# Patient Record
Sex: Male | Born: 1955
Health system: Southern US, Community
[De-identification: ages and names within clinical notes are randomized; demographics above are authoritative.]

## PROBLEM LIST (undated history)

## (undated) DIAGNOSIS — B029 Zoster without complications: Secondary | ICD-10-CM

## (undated) DIAGNOSIS — N410 Acute prostatitis: Secondary | ICD-10-CM

## (undated) DIAGNOSIS — E785 Hyperlipidemia, unspecified: Secondary | ICD-10-CM

## (undated) DIAGNOSIS — K8020 Calculus of gallbladder without cholecystitis without obstruction: Secondary | ICD-10-CM

## (undated) DIAGNOSIS — H269 Unspecified cataract: Secondary | ICD-10-CM

## (undated) DIAGNOSIS — K8001 Calculus of gallbladder with acute cholecystitis with obstruction: Secondary | ICD-10-CM

## (undated) HISTORY — DX: Calculus of gallbladder without cholecystitis without obstruction: K80.20

## (undated) HISTORY — DX: Hyperlipidemia, unspecified: E78.5

## (undated) HISTORY — DX: Unspecified cataract: H26.9

## (undated) HISTORY — DX: Calculus of gallbladder with acute cholecystitis with obstruction: K80.01

## (undated) HISTORY — DX: Zoster without complications: B02.9

## (undated) HISTORY — DX: Acute prostatitis: N41.0

---

## 1975-10-11 HISTORY — PX: WISDOM TOOTH EXTRACTION: SHX21

## 1990-10-10 HISTORY — PX: SEPTOPLASTY: SUR1290

## 1999-08-26 ENCOUNTER — Other Ambulatory Visit: Admission: RE | Admit: 1999-08-26 | Discharge: 1999-08-26 | Payer: Self-pay | Admitting: *Deleted

## 1999-08-26 ENCOUNTER — Encounter (INDEPENDENT_AMBULATORY_CARE_PROVIDER_SITE_OTHER): Payer: Self-pay | Admitting: Specialist

## 2005-01-05 ENCOUNTER — Ambulatory Visit: Payer: Self-pay | Admitting: Family Medicine

## 2005-01-12 ENCOUNTER — Ambulatory Visit: Payer: Self-pay | Admitting: Family Medicine

## 2005-01-28 ENCOUNTER — Ambulatory Visit: Payer: Self-pay | Admitting: Family Medicine

## 2005-12-23 ENCOUNTER — Ambulatory Visit: Payer: Self-pay | Admitting: Family Medicine

## 2006-10-13 ENCOUNTER — Ambulatory Visit: Payer: Self-pay | Admitting: Family Medicine

## 2006-10-13 LAB — CONVERTED CEMR LAB: PSA: 0.52 ng/mL

## 2006-10-17 ENCOUNTER — Ambulatory Visit: Payer: Self-pay | Admitting: Family Medicine

## 2006-10-24 ENCOUNTER — Ambulatory Visit: Payer: Self-pay

## 2006-10-24 HISTORY — PX: OTHER SURGICAL HISTORY: SHX169

## 2006-10-30 ENCOUNTER — Ambulatory Visit: Payer: Self-pay | Admitting: Family Medicine

## 2009-03-26 ENCOUNTER — Encounter: Payer: Self-pay | Admitting: Family Medicine

## 2009-03-26 DIAGNOSIS — E785 Hyperlipidemia, unspecified: Secondary | ICD-10-CM | POA: Insufficient documentation

## 2009-03-30 ENCOUNTER — Ambulatory Visit: Payer: Self-pay | Admitting: Family Medicine

## 2009-04-07 ENCOUNTER — Encounter: Payer: Self-pay | Admitting: Family Medicine

## 2010-05-18 ENCOUNTER — Encounter (INDEPENDENT_AMBULATORY_CARE_PROVIDER_SITE_OTHER): Payer: Self-pay | Admitting: *Deleted

## 2010-11-05 ENCOUNTER — Telehealth: Payer: Self-pay | Admitting: Family Medicine

## 2010-11-08 ENCOUNTER — Encounter: Payer: Self-pay | Admitting: Family Medicine

## 2010-11-09 NOTE — Letter (Signed)
Summary: Nadara Eaton letter  [place] at [facility]  [address] [place], [place] [address]   Phone: [phone]  Fax: [phone]       05/18/2010 MRN: [id number]  [facility] [address] [place], [place]  [address]  Dear Mr. MOHSEN, [facility] [place], and [facility] announce the retirement of Arta Silence, M.D., from full-time practice at the [facility] office effective April 08, 2010 and his plans of returning part-time.  It is important to Dr. Hetty Ely and to our practice that you understand that [facility] has seven physicians in our office for your health care needs.  We will continue to offer the same exceptional care that you have today.    Dr. Hetty Ely has spoken to many of you about his plans for retirement and returning part-time in the fall.   We will continue to work with you through the transition to schedule appointments for you in the office and meet the high standards that [place] is committed to.   Again, it is with great pleasure that we share the news that Dr. Hetty Ely will return to [facility] at [facility] in October of 2011 with a reduced schedule.    If you have any questions, or would like to request an appointment with one of our physicians, please call us at [phone] and press the option for Scheduling an appointment.  We take pleasure in providing you with excellent patient care and look forward to seeing you at your next office visit.  Our [facility] Physicians are:  Tillman Abide, M.D. Laurita Quint, M.D. Roxy Manns, M.D. Kerby Nora, M.D. Hannah Beat, M.D. Ruthe Mannan, M.D. We proudly welcomed Raechel Ache, M.D. and Eustaquio Boyden, M.D. to the practice in July/August 2011.  Sincerely,   [facility]

## 2010-11-10 ENCOUNTER — Encounter (INDEPENDENT_AMBULATORY_CARE_PROVIDER_SITE_OTHER): Payer: 59 | Admitting: Family Medicine

## 2010-11-10 ENCOUNTER — Encounter: Payer: Self-pay | Admitting: Family Medicine

## 2010-11-10 ENCOUNTER — Ambulatory Visit: Admit: 2010-11-10 | Payer: Self-pay | Admitting: Family Medicine

## 2010-11-10 DIAGNOSIS — Z0000 Encounter for general adult medical examination without abnormal findings: Secondary | ICD-10-CM

## 2010-11-11 NOTE — Progress Notes (Signed)
Summary: pt needs order for labs  Phone Note Call from Patient Call back at [phone]   Caller: Patient Call For: Shaune Leeks MD Summary of Call: Pt has an appt for a physical on 2/1 and he is asking for an order for labs to take to [facility].  Dr. Paralee Cancel be back in office until 2/1, will you write order for pt?  Please call pt when ready. Initial call taken by: Lowella Petties CMA, AAMA,  November 05, 2010 9:15 AM  Follow-up for Phone Call        Please order Comp Metabolic 790.29 and 272.4, Lipid profile 272.4 TSH 272.4 PSA V76.44 and Microalbunmin 790.29. Follow-up by: Shaune Leeks MD,  November 05, 2010 12:27 PM  Additional Follow-up for Phone Call Additional follow up Details #1::        Wrote orders on paper, stamped with Dr. Lyndel Pleasure signature, given to pt. Additional Follow-up by: Lowella Petties CMA, AAMA,  November 05, 2010 1:08 PM

## 2010-11-17 ENCOUNTER — Encounter (INDEPENDENT_AMBULATORY_CARE_PROVIDER_SITE_OTHER): Payer: Self-pay | Admitting: *Deleted

## 2010-11-17 ENCOUNTER — Other Ambulatory Visit: Payer: 59

## 2010-11-17 ENCOUNTER — Other Ambulatory Visit: Payer: Self-pay

## 2010-11-17 DIAGNOSIS — Z0000 Encounter for general adult medical examination without abnormal findings: Secondary | ICD-10-CM

## 2010-11-17 NOTE — Letter (Signed)
Summary: [place] [facility]: Immunoassay Fecal Occult Blood (iFOB) Order Form  [place] at [facility]  [address] [place], [place] [address]   Phone: [phone]  Fax: [phone]      [place] [facility]: Immunoassay Fecal Occult Blood (iFOB) Order Form   November 10, 2010 MRN: [id number]   KIAM BRANSFIELD 24-Dec-1955   Physicican Name:______Dr Schaller___________________  Diagnosis Code:______V70.0____________________      Shaune Leeks MD

## 2010-11-17 NOTE — Assessment & Plan Note (Signed)
Summary: CPX/CLE   Vital Signs:  Patient profile:   55 year old male Weight:      201 pounds BMI:     28.14 Temp:     97.4 degrees F oral Pulse rate:   64 / minute Pulse rhythm:   regular BP sitting:   106 / 68  (left arm) Cuff size:   large  Vitals Entered By: Sydell Axon LPN (November 10, 2010 9:29 AM) CC: 30 Minute checkup   History of Present Illness: Pt here for Comp Exam. HE has had some back discomfort with his exercises and has gained a few pounds back.  Preventive Screening-Counseling & Management  Alcohol-Tobacco     Alcohol drinks/day: <1     Alcohol type: wine occas liquor     Smoking Status: current     Cigars/week: one a year     Pipe use/week: occas  Caffeine-Diet-Exercise     Caffeine use/day: 3 per day     Does Patient Exercise: yes     Type of exercise: running/treadmill     Exercise (avg: min/session): 11/2 hrs every other dayaltn with light wts     Times/week: 6  Problems Prior to Update: 1)  Health Maintenance Exam  (ICD-V70.0) 2)  Coronary Art Dz, Fam Hx (BRO DEC 54YOA)  (ICD-V17.3) 3)  Hyperglycemia (102)  (ICD-790.29) 4)  Hyperlipidemia  (ICD-272.4)  Medications Prior to Update: 1)  Aspirin 81 Mg  Tabs (Aspirin) .... One Daily  Current Medications (verified): 1)  Aspirin 81 Mg  Tabs (Aspirin) .... One Daily 2)  Multivitamins  Tabs (Multiple Vitamin) .... Take One By Mouth Daily 3)  Calcium 1000 + D 1000-800 Mg-Unit Tabs (Calcium Carb-Cholecalciferol) .... Take One By Mouth Daily  Allergies: No Known Drug Allergies  Past History:  Past Medical History: Last updated: 03/26/2009 Hyperlipidemia  Family History: Last updated: 11/10/2010 Father:  dec 76 Emphysema Mother: dec74 pneumonia due to severe peripheral neuropahty and other related neuropathies Brother dec 54  MI Sister A 62  hypertension  diabetes HBP + sister DM + sister  Cancer - none Depression - none ETOH/Drug abuse - none  Social History: Last updated:  03/26/2009 Marital Status: Married lives with wife Children: 3 Occupation: Therapist, sports for [employer]  Risk Factors: Alcohol Use: <1 (11/10/2010) Caffeine Use: 3 per day (11/10/2010) Exercise: yes (11/10/2010)  Risk Factors: Smoking Status: current (11/10/2010) Cigars/wk: one a year (11/10/2010) Pipe Use/wk: occas (11/10/2010)  Past Surgical History: UVPP and Septoplasty 1992 Stress Myoview, wnl except ST seg decrease inferolaterally 10/24/06  Family History: Father:  dec 76 Emphysema Mother: dec74 pneumonia due to severe peripheral neuropahty and other related neuropathies Brother dec 54  MI Sister A 62  hypertension  diabetes HBP + sister DM + sister  Cancer - none Depression - none ETOH/Drug abuse - none  Review of Systems General:  Denies chills, fatigue, fever, sweats, weakness, and weight loss. Eyes:  Denies blurring, discharge, and eye pain. ENT:  Denies decreased hearing, earache, and ringing in ears. CV:  Denies chest pain or discomfort, fainting, fatigue, palpitations, shortness of breath with exertion, swelling of feet, and swelling of hands. Resp:  Denies cough, shortness of breath, and wheezing. GI:  Denies abdominal pain, bloody stools, change in bowel habits, constipation, dark tarry stools, diarrhea, indigestion, loss of appetite, nausea, vomiting, vomiting blood, and yellowish skin color. GU:  Complains of urinary frequency; denies discharge, dysuria, and nocturia. MS:  Complains of low back pain; denies joint pain, muscle aches,  and stiffness; from exercising. Derm:  Complains of dryness; denies itching and rash. Neuro:  Denies numbness, poor balance, tingling, and tremors.  Physical Exam  General:  Well-developed,well-nourished,in no acute distress; alert,appropriate and cooperative throughout examination Head:  Normocephalic and atraumatic without obvious abnormalities. No apparent alopecia or balding. Sinuses NT. Eyes:  Conjunctiva clear bilaterally.   Ears:  External ear exam shows no significant lesions or deformities.  Otoscopic examination reveals clear canals, tympanic membranes are intact bilaterally without bulging, retraction, inflammation or discharge. Hearing is grossly normal bilaterally. Nose:  External nasal examination shows no deformity or inflammation. Nasal mucosa are pink and moist without lesions or exudates. Mouth:  Oral mucosa and oropharynx without lesions or exudates.  Teeth in good repair. Neck:  No deformities, masses, or tenderness noted. Chest Wall:  No deformities, masses, tenderness or gynecomastia noted. Breasts:  No masses or gynecomastia noted Lungs:  Normal respiratory effort, chest expands symmetrically. Lungs are clear to auscultation, no crackles or wheezes. Heart:  Normal rate and regular rhythm. S1 and S2 normal without gallop, murmur, click, rub or other extra sounds. Abdomen:  Bowel sounds positive,abdomen soft and non-tender without masses, organomegaly or hernias noted. Rectal:  No external abnormalities noted. Normal sphincter tone. No rectal masses or tenderness. G neg. Genitalia:  Testes bilaterally descended without nodularity, tenderness or masses. No scrotal masses or lesions. No penis lesions or urethral discharge. Prostate:  Prostate gland firm and smooth, no enlargement, nodularity, tenderness, mass, asymmetry or induration. 20gms. Msk:  No deformity or scoliosis noted of thoracic or lumbar spine.   Pulses:  R and L carotid,radial,femoral,dorsalis pedis and posterior tibial pulses are full and equal bilaterally Extremities:  No clubbing, cyanosis, edema, or deformity noted with normal full range of motion of all joints.   Neurologic:  No cranial nerve deficits noted. Station and gait are normal. Sensory, motor and coordinative functions appear intact. Skin:  Intact without suspicious lesions or rashes Cervical Nodes:  No lymphadenopathy noted Inguinal Nodes:  No significant adenopathy Psych:   Cognition and judgment appear intact. Alert and cooperative with normal attention span and concentration. No apparent delusions, illusions, hallucinations   Impression & Recommendations:  Problem # 1:  HEALTH MAINTENANCE EXAM (ICD-V70.0)  Reviewed preventive care protocols, scheduled due services, and updated immunizations. Still prefers stool cards for colon cancer screening.  Problem # 2:  HYPERGLYCEMIA (102) (ICD-790.29) Assessment: Improved Euglycemic with weight loss, altho slight gain since last visit.   Problem # 3:  HYPERLIPIDEMIA (ICD-272.4) Assessment: Unchanged Nos great. Suggest fatty food restriction to decrease LDL even more.  Complete Medication List: 1)  Aspirin 81 Mg Tabs (Aspirin) .... One daily 2)  Multivitamins Tabs (Multiple vitamin) .... Take one by mouth daily 3)  Calcium 1000 + D 1000-800 Mg-unit Tabs (Calcium carb-cholecalciferol) .... Take one by mouth daily  Patient Instructions: 1)  RTC one year, sooner as needed.   Orders Added: 1)  Est. Patient 40-64 years [99396]    Current Allergies (reviewed today): No known allergies

## 2010-11-18 ENCOUNTER — Encounter (INDEPENDENT_AMBULATORY_CARE_PROVIDER_SITE_OTHER): Payer: Self-pay | Admitting: *Deleted

## 2010-11-25 NOTE — Letter (Signed)
Summary: Results Follow up Letter  [place] [facility]  [address] [place], [place] [address]   Phone: [phone]  Fax: [phone]    11/18/2010 MRN: [id number]    Marc Bolton [address] [place], [place]  [address]  [place]    Dear Mr. VERBA,  The following are the results of your recent test(s):  Test         Result    Pap Smear:        Normal _____  Not Normal _____ Comments: ______________________________________________________ Cholesterol: LDL(Bad cholesterol):         Your goal is less than:         HDL (Good cholesterol):       Your goal is more than: Comments:  ______________________________________________________ Mammogram:        Normal _____  Not Normal _____ Comments:  ___________________________________________________________________ Hemoccult:        Normal __X___  Not normal _______ Comments:  Please repeat in one year.  _____________________________________________________________________ Other Tests:    We routinely do not discuss normal results over the telephone.  If you desire a copy of the results, or you have any questions about this information we can discuss them at your next office visit.   Sincerely,      Laurita Quint, MD

## 2011-07-01 ENCOUNTER — Telehealth: Payer: Self-pay | Admitting: *Deleted

## 2011-07-01 NOTE — Telephone Encounter (Signed)
Patient called inquiring about the Shingles vaccine.  He stated that he doesn't remember whether or not he had the chicken pox as a child but would like the shingles vaccine.  Advised patient to check with his insurance company to see if they will cover the vaccine and call us back.  Is it ok for patient to have shingles vaccine?

## 2011-07-01 NOTE — Telephone Encounter (Signed)
Patient advised as instructed via telephone. 

## 2011-07-01 NOTE — Telephone Encounter (Signed)
It is ok with me. If insurance oks, may send script to pharmacy of choice.

## 2011-11-29 ENCOUNTER — Telehealth: Payer: Self-pay | Admitting: Family Medicine

## 2011-11-29 NOTE — Telephone Encounter (Signed)
Placed script in kim's box.

## 2011-11-29 NOTE — Telephone Encounter (Signed)
Patient is transferring from Dr.Schaller to you.  His CPX is scheduled for March 4th.  Patient has his labwork done at [facility].  Can he get an order for the labwork?

## 2011-11-30 NOTE — Telephone Encounter (Signed)
Message left notifying patient. Script placed up front for patient pick up.

## 2011-12-05 ENCOUNTER — Encounter: Payer: Self-pay | Admitting: Family Medicine

## 2011-12-09 ENCOUNTER — Encounter: Payer: Self-pay | Admitting: Family Medicine

## 2011-12-12 ENCOUNTER — Encounter: Payer: Self-pay | Admitting: Family Medicine

## 2011-12-12 ENCOUNTER — Encounter: Payer: Self-pay | Admitting: Gastroenterology

## 2011-12-12 ENCOUNTER — Ambulatory Visit (INDEPENDENT_AMBULATORY_CARE_PROVIDER_SITE_OTHER): Payer: 59 | Admitting: Family Medicine

## 2011-12-12 VITALS — BP 110/70 | HR 64 | Temp 98.8°F | Ht 72.0 in | Wt 210.0 lb

## 2011-12-12 DIAGNOSIS — Z0000 Encounter for general adult medical examination without abnormal findings: Secondary | ICD-10-CM | POA: Insufficient documentation

## 2011-12-12 DIAGNOSIS — E785 Hyperlipidemia, unspecified: Secondary | ICD-10-CM

## 2011-12-12 DIAGNOSIS — Z1211 Encounter for screening for malignant neoplasm of colon: Secondary | ICD-10-CM

## 2011-12-12 DIAGNOSIS — Z23 Encounter for immunization: Secondary | ICD-10-CM

## 2011-12-12 MED ORDER — DICLOFENAC SODIUM 1 % TD GEL: 1.0000 "application " | Freq: Three times a day (TID) | TRANSDERMAL | Status: DC | PRN

## 2011-12-12 NOTE — Progress Notes (Signed)
Subjective:    Patient ID: Marc Bolton, male    DOB: 07/21/1956, 56 y.o.   MRN: [id number]  HPI CC: CPE  No questions or concerns today.  Runs 10ks.  Takes ibuprofen prn, has used voltaren gel in past and done well with this requests refill.  No h/o GERD or PUD or other abd issues.  Discussed calcium supplement use.  Daughter pregnant, husband leaving to be deployed for 1 year.  Pt wants immunizations   Preventative: Last CPE 1 year ago.  Brings labs from [facility]. Colon screening - would like colonoscopy. prostate - gets checked yearly.  Nocturia - 1x/night.  Normal stream  Flu - 2012 Td - 2006.  Requests Tdap today.  Caffeine: 3 cups coffee/day Married; Lives with wife 3 children Data processor for [employer] Activity: works out 4x/wk, runs 15-20 miles /wk Diet: good water, fruits/vegetables daily, very little fried food/fatty food  Medications and allergies reviewed and updated in chart.  Past histories reviewed and updated if relevant as below. Patient Active Problem List  Diagnoses  . HYPERLIPIDEMIA   Past Medical History  Diagnosis Date  . HLD (hyperlipidemia)    Past Surgical History  Procedure Date  . Uvulopalatopharyngoplasty 1992  . Septoplasty 1992  . Stress myoview 10/24/06    WNL except ST seg decrease inferiolaterally   History  Substance Use Topics  . Smoking status: Never Smoker   . Smokeless tobacco: Never Used   Comment: Very rare cigar  . Alcohol Use: Yes     Wine-Regular   Family History  Problem Relation Age of Onset  . Emphysema Father   . Neuropathy Mother     severe peripheral  . Hypertension Sister   . Diabetes Sister   . Coronary artery disease Brother 53  . Stroke Neg Hx   . Cancer Neg Hx    No Known Allergies Current Outpatient Prescriptions on File Prior to Visit  Medication Sig Dispense Refill  . aspirin 81 MG tablet Take 81 mg by mouth daily.      . Calcium Carb-Cholecalciferol 1000-800 MG-UNIT TABS Take 1 tablet by  mouth daily.      . Multiple Vitamin (MULTIVITAMIN) tablet Take 1 tablet by mouth daily.          Review of Systems  Constitutional: Negative for fever, chills, activity change, appetite change, fatigue and unexpected weight change.  HENT: Negative for hearing loss and neck pain.   Eyes: Negative for visual disturbance.  Respiratory: Negative for cough, chest tightness, shortness of breath and wheezing.   Cardiovascular: Negative for chest pain, palpitations and leg swelling.  Gastrointestinal: Negative for nausea, vomiting, abdominal pain, diarrhea, constipation, blood in stool and abdominal distention.  Genitourinary: Negative for hematuria and difficulty urinating.  Musculoskeletal: Negative for myalgias and arthralgias.  Skin: Negative for rash.  Neurological: Negative for dizziness, seizures, syncope and headaches.  Hematological: Does not bruise/bleed easily.  Psychiatric/Behavioral: Negative for dysphoric mood. The patient is not nervous/anxious.        Objective:   Physical Exam  Nursing note and vitals reviewed. Constitutional: He is oriented to person, place, and time. He appears well-developed and well-nourished. No distress.  HENT:  Head: Normocephalic and atraumatic.  Right Ear: External ear normal.  Left Ear: External ear normal.  Nose: Nose normal.  Mouth/Throat: Oropharynx is clear and moist. No oropharyngeal exudate.  Eyes: Conjunctivae and EOM are normal. Pupils are equal, round, and reactive to light. No scleral icterus.  Neck: Normal range of motion. Neck  supple.  Cardiovascular: Normal rate, regular rhythm, normal heart sounds and intact distal pulses.   No murmur heard. Pulses:      Radial pulses are 2+ on the right side, and 2+ on the left side.  Pulmonary/Chest: Effort normal and breath sounds normal. No respiratory distress. He has no wheezes. He has no rales.  Abdominal: Soft. Bowel sounds are normal. He exhibits no distension and no mass. There is no  tenderness. There is no rebound and no guarding.  Genitourinary: Rectum normal and prostate normal. Rectal exam shows no external hemorrhoid, no internal hemorrhoid, no fissure, no mass, no tenderness and anal tone normal. Prostate is not enlarged (20gm) and not tender.  Musculoskeletal: Normal range of motion. He exhibits no edema.  Lymphadenopathy:    He has no cervical adenopathy.  Neurological: He is alert and oriented to person, place, and time.       CN grossly intact, station and gait intact  Skin: Skin is warm and dry. No rash noted.  Psychiatric: He has a normal mood and affect. His behavior is normal. Judgment and thought content normal.      Assessment & Plan:

## 2011-12-12 NOTE — Assessment & Plan Note (Addendum)
Preventative protocols reviewed and updated unless pt declined. Tdap today. Discussed yearly flu shot. Discussed healthy diet/lifestyle changes. Refer for screening colonoscopy. DRE/PSA reassuring today.

## 2011-12-12 NOTE — Assessment & Plan Note (Signed)
Reviewed numbers with pt fmhx CAD, rec LDL goal <100, but <130 likely adequate. rec stop calcium supplements.

## 2011-12-12 NOTE — Patient Instructions (Signed)
Tdap today. Flu shot recommended next year. Good to see you today, call us with quesitons. Return in 1 year or as needed.

## 2012-01-09 HISTORY — PX: COLONOSCOPY: SHX174

## 2012-01-17 ENCOUNTER — Ambulatory Visit (AMBULATORY_SURGERY_CENTER): Payer: 59 | Admitting: *Deleted

## 2012-01-17 VITALS — Ht 72.0 in | Wt 207.7 lb

## 2012-01-17 DIAGNOSIS — Z1211 Encounter for screening for malignant neoplasm of colon: Secondary | ICD-10-CM

## 2012-01-17 MED ORDER — PEG-KCL-NACL-NASULF-NA ASC-C 100 G PO SOLR: ORAL | Status: DC

## 2012-01-18 ENCOUNTER — Encounter: Payer: Self-pay | Admitting: Gastroenterology

## 2012-01-31 ENCOUNTER — Encounter: Payer: Self-pay | Admitting: Gastroenterology

## 2012-01-31 ENCOUNTER — Ambulatory Visit (AMBULATORY_SURGERY_CENTER): Payer: 59 | Admitting: Gastroenterology

## 2012-01-31 VITALS — BP 125/73 | HR 69 | Temp 98.1°F | Resp 18 | Ht 72.0 in | Wt 207.0 lb

## 2012-01-31 DIAGNOSIS — K633 Ulcer of intestine: Secondary | ICD-10-CM

## 2012-01-31 DIAGNOSIS — K5730 Diverticulosis of large intestine without perforation or abscess without bleeding: Secondary | ICD-10-CM

## 2012-01-31 DIAGNOSIS — Z1211 Encounter for screening for malignant neoplasm of colon: Secondary | ICD-10-CM

## 2012-01-31 DIAGNOSIS — D126 Benign neoplasm of colon, unspecified: Secondary | ICD-10-CM

## 2012-01-31 MED ORDER — SODIUM CHLORIDE 0.9 % IV SOLN: 500.0000 mL | INTRAVENOUS | Status: DC

## 2012-01-31 NOTE — Patient Instructions (Signed)
YOU HAD AN ENDOSCOPIC PROCEDURE TODAY AT THE [facility]: Refer to the procedure report that was given to you for any specific questions about what was found during the examination.  If the procedure report does not answer your questions, please call your gastroenterologist to clarify.  If you requested that your care partner not be given the details of your procedure findings, then the procedure report has been included in a sealed envelope for you to review at your convenience later.  YOU SHOULD EXPECT: Some feelings of bloating in the abdomen. Passage of more gas than usual.  Walking can help get rid of the air that was put into your GI tract during the procedure and reduce the bloating. If you had a lower endoscopy (such as a colonoscopy or flexible sigmoidoscopy) you may notice spotting of blood in your stool or on the toilet paper. If you underwent a bowel prep for your procedure, then you may not have a normal bowel movement for a few days.  DIET: Your first meal following the procedure should be a light meal and then it is ok to progress to your normal diet.  A half-sandwich or bowl of soup is an example of a good first meal.  Heavy or fried foods are harder to digest and may make you feel nauseous or bloated.  Likewise meals heavy in dairy and vegetables can cause extra gas to form and this can also increase the bloating.  Drink plenty of fluids but you should avoid alcoholic beverages for 24 hours.  ACTIVITY: Your care partner should take you home directly after the procedure.  You should plan to take it easy, moving slowly for the rest of the day.  You can resume normal activity the day after the procedure however you should NOT DRIVE or use heavy machinery for 24 hours (because of the sedation medicines used during the test).    SYMPTOMS TO REPORT IMMEDIATELY: A gastroenterologist can be reached at any hour.  During normal business hours, 8:30 AM to 5:00 PM Monday through Friday,  call [phone].  After hours and on weekends, please call the GI answering service at [phone] who will take a message and have the physician on call contact you.   Following lower endoscopy (colonoscopy or flexible sigmoidoscopy):  Excessive amounts of blood in the stool  Significant tenderness or worsening of abdominal pains  Swelling of the abdomen that is new, acute  Fever of 100F or higher    FOLLOW UP: If any biopsies were taken you will be contacted by phone or by letter within the next 1-3 weeks.  Call your gastroenterologist if you have not heard about the biopsies in 3 weeks.  Our staff will call the home number listed on your records the next business day following your procedure to check on you and address any questions or concerns that you may have at that time regarding the information given to you following your procedure. This is a courtesy call and so if there is no answer at the home number and we have not heard from you through the emergency physician on call, we will assume that you have returned to your regular daily activities without incident.  SIGNATURES/CONFIDENTIALITY: You and/or your care partner have signed paperwork which will be entered into your electronic medical record.  These signatures attest to the fact that that the information above on your After Visit Summary has been reviewed and is understood.  Full responsibility of the confidentiality   of this discharge information lies with you and/or your care-partner.     

## 2012-01-31 NOTE — Progress Notes (Deleted)
YOU HAD AN ENDOSCOPIC PROCEDURE TODAY AT THE [facility]: Refer to the procedure report that was given to you for any specific questions about what was found during the examination.  If the procedure report does not answer your questions, please call your gastroenterologist to clarify.  If you requested that your care partner not be given the details of your procedure findings, then the procedure report has been included in a sealed envelope for you to review at your convenience later.  YOU SHOULD EXPECT: Some feelings of bloating in the abdomen. Passage of more gas than usual.  Walking can help get rid of the air that was put into your GI tract during the procedure and reduce the bloating. If you had a lower endoscopy (such as a colonoscopy or flexible sigmoidoscopy) you may notice spotting of blood in your stool or on the toilet paper. If you underwent a bowel prep for your procedure, then you may not have a normal bowel movement for a few days.  DIET: Your first meal following the procedure should be a light meal and then it is ok to progress to your normal diet.  A half-sandwich or bowl of soup is an example of a good first meal.  Heavy or fried foods are harder to digest and may make you feel nauseous or bloated.  Likewise meals heavy in dairy and vegetables can cause extra gas to form and this can also increase the bloating.  Drink plenty of fluids but you should avoid alcoholic beverages for 24 hours.  ACTIVITY: Your care partner should take you home directly after the procedure.  You should plan to take it easy, moving slowly for the rest of the day.  You can resume normal activity the day after the procedure however you should NOT DRIVE or use heavy machinery for 24 hours (because of the sedation medicines used during the test).    SYMPTOMS TO REPORT IMMEDIATELY: A gastroenterologist can be reached at any hour.  During normal business hours, 8:30 AM to 5:00 PM Monday through Friday,  call [phone].  After hours and on weekends, please call the GI answering service at [phone] who will take a message and have the physician on call contact you.   Following lower endoscopy (colonoscopy or flexible sigmoidoscopy):  Excessive amounts of blood in the stool  Significant tenderness or worsening of abdominal pains  Swelling of the abdomen that is new, acute  Fever of 100F or higher  Following upper endoscopy (EGD)  Vomiting of blood or coffee ground material  New chest pain or pain under the shoulder blades  Painful or persistently difficult swallowing  New shortness of breath  Fever of 100F or higher  Black, tarry-looking stools  FOLLOW UP: If any biopsies were taken you will be contacted by phone or by letter within the next 1-3 weeks.  Call your gastroenterologist if you have not heard about the biopsies in 3 weeks.  Our staff will call the home number listed on your records the next business day following your procedure to check on you and address any questions or concerns that you may have at that time regarding the information given to you following your procedure. This is a courtesy call and so if there is no answer at the home number and we have not heard from you through the emergency physician on call, we will assume that you have returned to your regular daily activities without incident.  SIGNATURES/CONFIDENTIALITY: You and/or your care   partner have signed paperwork which will be entered into your electronic medical record.  These signatures attest to the fact that that the information above on your After Visit Summary has been reviewed and is understood.  Full responsibility of the confidentiality of this discharge information lies with you and/or your care-partner.  

## 2012-01-31 NOTE — Op Note (Signed)
[facility] [address] [place], [place]  [address]  COLONOSCOPY PROCEDURE REPORT  PATIENT:  Marc Bolton, Marc Bolton  MR#:  [id number] BIRTHDATE:  20-Nov-1955, 55 yrs. old  GENDER:  male ENDOSCOPIST:  Rachael Fee, MD REF. BY:  Eustaquio Boyden, M.D. PROCEDURE DATE:  01/31/2012 PROCEDURE:  Colonoscopy with biopsy and snare polypectomy ASA CLASS:  Class II INDICATIONS:  Routine Risk Screening MEDICATIONS:   Fentanyl 50 mcg IV, These medications were titrated to patient response per physician's verbal order, Versed 7 mg IV  DESCRIPTION OF PROCEDURE:   After the risks benefits and alternatives of the procedure were thoroughly explained, informed consent was obtained.  Digital rectal exam was performed and revealed no rectal masses.   The LB [id number] endoscope was introduced through the anus and advanced to the cecum, which was identified by both the appendix and ileocecal valve, without limitations.  The quality of the prep was good..  The instrument was then slowly withdrawn as the colon was fully examined. <<PROCEDUREIMAGES>>  FINDINGS:  Three sessile polyps were found, all were removed with cold snare and all were sent to pathology (jar 1). These ranged in size from 2mm to 5mm, located in ascending and descending segments (see image5 and image6).  An erosion was found in the cecum. This was focal, surrounded by slightly edematous mucosa. Likely NSAID related, but biopsied to exclude neoplasia (pathology jar 1) (see image3).  Mild diverticulosis was found in the sigmoid to descending colon segments (see image8).  This was otherwise a normal examination of the colon (see image9, image1, and image2). Retroflexed views in the rectum revealed no abnormalities. COMPLICATIONS:  None  ENDOSCOPIC IMPRESSION: 1) Three polyps were found, all were removed and all sent to pathology 2) Erosion in the cecum, likely NSAID related but biopsied to exclude neoplasia 3) Mild  diverticulosis in the sigmoid to descending colon segments 4) Otherwise normal examination  RECOMMENDATIONS: 1) If the polyp(s) removed today are proven to be adenomatous (pre-cancerous) polyps, you will need a repeat colonoscopy in 3-5 years. Otherwise you should continue to follow colorectal cancer screening guidelines for "routine risk" patients with colonoscopy in 10 years. You will receive a letter within 1-2 weeks with the results of your biopsy as well as final recommendations. Please call my office if you have not received a letter after 3 weeks.  ______________________________ Rachael Fee, MD  n. eSIGNED:   Rachael Fee at 01/31/2012 09:27 AM  Gwynneth Albright, [phone]

## 2012-01-31 NOTE — Progress Notes (Signed)
Patient did not have preoperative order for IV antibiotic SSI prophylaxis. (G8918)  Patient did not experience any of the following events: a burn prior to discharge; a fall within the facility; wrong site/side/patient/procedure/implant event; or a hospital transfer or hospital admission upon discharge from the facility. (G8907)  

## 2012-02-01 ENCOUNTER — Telehealth: Payer: Self-pay

## 2012-02-01 NOTE — Telephone Encounter (Signed)
  Follow up Call-  Call back number 01/31/2012  Post procedure Call Back phone  # [phone]  Permission to leave phone message Yes     Patient questions:  Do you have a fever, pain , or abdominal swelling? no Pain Score  0 *  Have you tolerated food without any problems? yes  Have you been able to return to your normal activities? yes  Do you have any questions about your discharge instructions: Diet   no Medications  no Follow up visit  no  Do you have questions or concerns about your Care? no  Actions: * If pain score is 4 or above: No action needed, pain <4.

## 2012-02-06 ENCOUNTER — Encounter: Payer: Self-pay | Admitting: Gastroenterology

## 2012-02-06 ENCOUNTER — Encounter: Payer: Self-pay | Admitting: Family Medicine

## 2012-06-25 ENCOUNTER — Encounter: Payer: Self-pay | Admitting: Family Medicine

## 2012-11-01 ENCOUNTER — Encounter: Payer: Self-pay | Admitting: Family Medicine

## 2012-11-01 ENCOUNTER — Ambulatory Visit (INDEPENDENT_AMBULATORY_CARE_PROVIDER_SITE_OTHER): Payer: 59 | Admitting: Family Medicine

## 2012-11-01 VITALS — BP 116/78 | HR 80 | Temp 98.3°F | Wt 216.2 lb

## 2012-11-01 DIAGNOSIS — B029 Zoster without complications: Secondary | ICD-10-CM

## 2012-11-01 HISTORY — DX: Zoster without complications: B02.9

## 2012-11-01 MED ORDER — HYDROCODONE-ACETAMINOPHEN 5-325 MG PO TABS: 1.0000 | ORAL_TABLET | Freq: Four times a day (QID) | ORAL | Status: DC | PRN

## 2012-11-01 MED ORDER — GABAPENTIN 300 MG PO CAPS: 300.0000 mg | ORAL_CAPSULE | Freq: Three times a day (TID) | ORAL | Status: DC

## 2012-11-01 MED ORDER — VALACYCLOVIR HCL 1 G PO TABS: 1000.0000 mg | ORAL_TABLET | Freq: Two times a day (BID) | ORAL | Status: DC

## 2012-11-01 NOTE — Patient Instructions (Signed)
You have shingles. Treat with valtrex three times daily for 7 days, continue ibuprofen and may use vicodin for breakthrough pain, start gabapentin at night time to help with nerve pain. Let us know if not improving as expected. We will discuss shingles shot at physical.  Shingles Shingles is caused by the same virus that causes chickenpox (varicella zoster virus or VZV). Shingles often occurs many years or decades after having chickenpox. That is why it is more common in adults older than 50 years. The virus reactivates and breaks out as an infection in a nerve root. SYMPTOMS   The initial feeling (sensations) may be pain. This pain is usually described as:  Burning.  Stabbing.  Throbbing.  Tingling in the nerve root.  A red rash will follow in a couple days. The rash may occur in any area of the body and is usually on one side (unilateral) of the body in a band or belt-like pattern. The rash usually starts out as very small blisters (vesicles). They will dry up after 7 to 10 days. This is not usually a significant problem except for the pain it causes.  Long-lasting (chronic) pain is more likely in an elderly person. It can last months to years. This condition is called postherpetic neuralgia. Shingles can be an extremely severe infection in someone with AIDS, a weakened immune system, or with forms of leukemia. It can also be severe if you are taking transplant medicines or other medicines that weaken the immune system. TREATMENT  Your caregiver will often treat you with:  Antiviral drugs.  Anti-inflammatory drugs.  Pain medicines. Bed rest is very important in preventing the pain associated with herpes zoster (postherpetic neuralgia). Application of heat in the form of a hot water bottle or electric heating pad or gentle pressure with the hand is recommended to help with the pain or discomfort. PREVENTION  A varicella zoster vaccine is available to help protect against the virus.  The Food and Drug Administration approved the varicella zoster vaccine for individuals 49 years of age and older. HOME CARE INSTRUCTIONS   Cool compresses to the area of rash may be helpful.  Only take over-the-counter or prescription medicines for pain, discomfort, or fever as directed by your caregiver.  Avoid contact with:  Babies.  Pregnant women.  Children with eczema.  Elderly people with transplants.  People with chronic illnesses, such as leukemia and AIDS.  If the area involved is on your face, you may receive a referral for follow-up to a specialist. It is very important to keep all follow-up appointments. This will help avoid eye complications, chronic pain, or disability. SEEK IMMEDIATE MEDICAL CARE IF:   You develop any pain (headache) in the area of the face or eye. This must be followed carefully by your caregiver or ophthalmologist. An infection in part of your eye (cornea) can be very serious. It could lead to blindness.  You do not have pain relief from prescribed medicines.  Your redness or swelling spreads.  The area involved becomes very swollen and painful.  You have a fever.  You notice any red or painful lines extending away from the affected area toward your heart (lymphangitis).  Your condition is worsening or has changed. Document Released: 09/26/2005 Document Revised: 12/19/2011 Document Reviewed: 08/31/2009 [facility] Patient Information 2013 [place], [place].

## 2012-11-01 NOTE — Assessment & Plan Note (Signed)
Rash and story consistent with shingles. Treat with valcyclovir, continued ibuprofen with vicodin for breakthrough pain, and gabapentin at night for nerve pain. Pt agrees with plan. Discussed zostavax at physical. Recommended against having grandchild visit this weekend.

## 2012-11-01 NOTE — Progress Notes (Signed)
  Subjective:    Patient ID: Marc Bolton, male    DOB: 02/28/1956, 57 y.o.   MRN: [id number]  HPI CC: R thigh pain  2 wks ago had cold.  Resolved.  About 1 wk ago started noticing trouble with right anterior thigh - had started exercising again, nothing strenuous.  Used to run 8k's.  R anterior thigh with pins and needles sensation initially.  Rash started last night.  Tender to touch.  Pain described as sharp pin prick sensation.  So far has tried heat, voltaren gel, aspirin and ibuprofen.  Not positional thigh pain, no weakness.  Unsure if chicken pox. Has not recently been in contact with anyone with shingles. Around 83 mo old grandchild - they were planning on visiting this weekend.  Past Medical History  Diagnosis Date  . HLD (hyperlipidemia)     no per pt     Review of Systems Per HPI    Objective:   Physical Exam  Nursing note and vitals reviewed. Constitutional: He appears well-developed and well-nourished. No distress.  Skin: Rash noted.          Tender vesicular erythematous rash on anterior R thigh along L3/4 dermatomal distribution       Assessment & Plan:

## 2013-07-15 ENCOUNTER — Telehealth: Payer: Self-pay | Admitting: Family Medicine

## 2013-07-15 NOTE — Telephone Encounter (Signed)
Patient's coming for a physical on 07/19/13.  Patient works for [employer].  He wants to have his lab done before his appointment at [employer].  Can you write him an order and his wife will come by and pick it up?

## 2013-07-16 NOTE — Telephone Encounter (Signed)
Script written and placed im [name]'s box

## 2013-07-16 NOTE — Telephone Encounter (Signed)
Message left notifying patient and script placed up front for pick up.

## 2013-07-18 ENCOUNTER — Encounter: Payer: Self-pay | Admitting: Family Medicine

## 2013-07-19 ENCOUNTER — Encounter: Payer: Self-pay | Admitting: Family Medicine

## 2013-07-19 ENCOUNTER — Ambulatory Visit (INDEPENDENT_AMBULATORY_CARE_PROVIDER_SITE_OTHER): Payer: 59 | Admitting: Family Medicine

## 2013-07-19 VITALS — BP 110/72 | HR 64 | Temp 98.2°F | Ht 72.0 in | Wt 213.5 lb

## 2013-07-19 DIAGNOSIS — Z0000 Encounter for general adult medical examination without abnormal findings: Secondary | ICD-10-CM

## 2013-07-19 DIAGNOSIS — N138 Other obstructive and reflux uropathy: Secondary | ICD-10-CM | POA: Insufficient documentation

## 2013-07-19 DIAGNOSIS — E785 Hyperlipidemia, unspecified: Secondary | ICD-10-CM

## 2013-07-19 DIAGNOSIS — N401 Enlarged prostate with lower urinary tract symptoms: Secondary | ICD-10-CM | POA: Insufficient documentation

## 2013-07-19 DIAGNOSIS — R350 Frequency of micturition: Secondary | ICD-10-CM

## 2013-07-19 NOTE — Patient Instructions (Signed)
Call your insurance about the shingles shot to see if it is covered at your age or how much it would cost and where is cheaper (here or pharmacy).  If you want to receive here, call for nurse visit. Good to see you today, call us with questions. Return in 1 year for next physical, or as needed. Keep an eye on voiding - if worsening despite changes discussed let me know.

## 2013-07-19 NOTE — Assessment & Plan Note (Signed)
Likely BPH related although exam overall normal.  Pt will work on stopping liquids earlier prior to bedtime, use bathroom prior to bedtime every night, then will reassess. If persistent, consider alpha blocker vs finasteride.

## 2013-07-19 NOTE — Assessment & Plan Note (Signed)
Reviewed slowly increasing trend - likely diet and weight related Pt will work on healthy diet changes and weight loss to better control fmhx CAD, goal <100

## 2013-07-19 NOTE — Assessment & Plan Note (Signed)
Preventative protocols reviewed and updated unless pt declined. Discussed healthy diet and lifestyle.  PSA/DRE reassuring today.

## 2013-07-19 NOTE — Progress Notes (Signed)
Subjective:    Patient ID: Marc Bolton, male    DOB: 09-25-1956, 57 y.o.   MRN: [id number]  HPI CC: CPE  Seat belt use discussed Sunscreen use discussed.  No changing molds.  Preventative:  Last CPE 1 year ago.  Brings labs from [facility].  Colonoscopy - cecal erosion, tubular adenoma, diverticulosis, rec rpt 3 yrs Christella Hartigan) Prostate - gets checked yearly. Nocturia - several times a night. Normal stream.  Flu - done Tdap - 2013  Shingles shot - did have shingles last year.  Interested in repeating.  Caffeine: 3 cups coffee/day  Married; Lives with wife  3 children  Data processor for [employer] Activity: works out 3x/wk, runs 15-20 miles/wk Diet: good water, fruits/vegetables daily, very little fried food/fatty food, fish weekly  Wt Readings from Last 3 Encounters:  07/19/13 213 lb 8 oz (96.843 kg)  11/01/12 216 lb 4 oz (98.09 kg)  01/31/12 207 lb (93.895 kg)     Medications and allergies reviewed and updated in chart.  Past histories reviewed and updated if relevant as below. Patient Active Problem List   Diagnosis Date Noted  . Shingles 11/01/2012  . Healthcare maintenance 12/12/2011  . HYPERLIPIDEMIA 03/26/2009   Past Medical History  Diagnosis Date  . HLD (hyperlipidemia)     no per pt   Past Surgical History  Procedure Laterality Date  . Septoplasty  1992  . Stress myoview  10/24/06    WNL except ST seg decrease inferiolaterally  . Colonoscopy  01/2012    polyps (tubular adenoma), cecal erosion, diverticulosis, rpt due 3 yrs   History  Substance Use Topics  . Smoking status: Current Some Day Smoker    Types: Cigars, Pipe  . Smokeless tobacco: Never Used     Comment: Very rare cigar  . Alcohol Use: 2.4 oz/week    4 Glasses of wine per week     Comment: Wine- weekend   Family History  Problem Relation Age of Onset  . Emphysema Father   . Neuropathy Mother     severe peripheral  . Hypertension Sister   . Diabetes Sister   . Coronary artery disease  Brother 82  . Stroke Neg Hx   . Cancer Neg Hx   . Colon cancer Neg Hx   . Esophageal cancer Neg Hx   . Stomach cancer Neg Hx   . Rectal cancer Neg Hx    No Known Allergies Current Outpatient Prescriptions on File Prior to Visit  Medication Sig Dispense Refill  . aspirin 81 MG tablet Take 81 mg by mouth daily.      . Calcium Carb-Cholecalciferol 1000-800 MG-UNIT TABS Take 1 tablet by mouth daily.      . diclofenac sodium (VOLTAREN) 1 % GEL Apply 1 application topically 3 (three) times daily as needed (AAA).  1 Tube  3  . Multiple Vitamin (MULTIVITAMIN) tablet Take 1 tablet by mouth daily.       Current Facility-Administered Medications on File Prior to Visit  Medication Dose Route Frequency Provider Last Rate Last Dose  . 0.9 %  sodium chloride infusion  500 mL Intravenous Continuous Rachael Fee, MD        Review of Systems  Constitutional: Negative for fever, chills, activity change, appetite change, fatigue and unexpected weight change.  HENT: Negative for hearing loss.   Eyes: Negative for visual disturbance.  Respiratory: Negative for cough, chest tightness, shortness of breath and wheezing.   Cardiovascular: Negative for chest pain, palpitations and leg swelling.  Gastrointestinal: Negative for nausea, vomiting, abdominal pain, diarrhea, constipation, blood in stool and abdominal distention.  Genitourinary: Negative for hematuria and difficulty urinating.  Musculoskeletal: Negative for arthralgias, myalgias and neck pain.  Skin: Negative for rash.  Neurological: Negative for dizziness, seizures, syncope and headaches.  Hematological: Negative for adenopathy. Does not bruise/bleed easily.  Psychiatric/Behavioral: Negative for dysphoric mood. The patient is not nervous/anxious.        Objective:   Physical Exam  Nursing note and vitals reviewed. Constitutional: He is oriented to person, place, and time. He appears well-developed and well-nourished. No distress.  HENT:   Head: Normocephalic and atraumatic.  Right Ear: Hearing, tympanic membrane, external ear and ear canal normal.  Left Ear: Hearing, tympanic membrane, external ear and ear canal normal.  Nose: Nose normal.  Mouth/Throat: Uvula is midline, oropharynx is clear and moist and mucous membranes are normal. No oropharyngeal exudate, posterior oropharyngeal edema, posterior oropharyngeal erythema or tonsillar abscesses.  Eyes: Conjunctivae and EOM are normal. Pupils are equal, round, and reactive to light. No scleral icterus.  Neck: Normal range of motion. Neck supple. No thyromegaly present.  Cardiovascular: Normal rate, regular rhythm, normal heart sounds and intact distal pulses.   No murmur heard. Pulses:      Radial pulses are 2+ on the right side, and 2+ on the left side.  Pulmonary/Chest: Effort normal and breath sounds normal. No respiratory distress. He has no wheezes. He has no rales.  Abdominal: Soft. Bowel sounds are normal. He exhibits no distension and no mass. There is no tenderness. There is no rebound and no guarding.  Genitourinary: Rectum normal and prostate normal. Rectal exam shows no external hemorrhoid, no internal hemorrhoid, no fissure, no mass, no tenderness and anal tone normal. Prostate is not enlarged (20gm) and not tender.  Musculoskeletal: Normal range of motion. He exhibits no edema.  Lymphadenopathy:    He has no cervical adenopathy.  Neurological: He is alert and oriented to person, place, and time.  CN grossly intact, station and gait intact  Skin: Skin is warm and dry. No rash noted.  Psychiatric: He has a normal mood and affect. His behavior is normal. Judgment and thought content normal.      Assessment & Plan:

## 2013-07-22 ENCOUNTER — Encounter: Payer: Self-pay | Admitting: Family Medicine

## 2013-07-23 MED ORDER — ZOSTER VACCINE LIVE 19400 UNT/0.65ML ~~LOC~~ SOLR: 0.6500 mL | Freq: Once | SUBCUTANEOUS | Status: DC

## 2013-07-23 NOTE — Telephone Encounter (Signed)
See mychart message. Printed script and placed in [name]'s box. plz notify ready to pick up.

## 2014-04-25 ENCOUNTER — Ambulatory Visit (INDEPENDENT_AMBULATORY_CARE_PROVIDER_SITE_OTHER): Payer: 59 | Admitting: Family Medicine

## 2014-04-25 ENCOUNTER — Encounter: Payer: Self-pay | Admitting: Family Medicine

## 2014-04-25 VITALS — BP 124/82 | HR 80 | Temp 98.1°F | Wt 209.2 lb

## 2014-04-25 DIAGNOSIS — R350 Frequency of micturition: Secondary | ICD-10-CM

## 2014-04-25 DIAGNOSIS — N410 Acute prostatitis: Secondary | ICD-10-CM

## 2014-04-25 HISTORY — DX: Acute prostatitis: N41.0

## 2014-04-25 LAB — POCT URINALYSIS DIPSTICK
BILIRUBIN UA: NEGATIVE
GLUCOSE UA: NEGATIVE
KETONES UA: NEGATIVE
LEUKOCYTES UA: NEGATIVE
NITRITE UA: NEGATIVE
Protein, UA: NEGATIVE
Spec Grav, UA: <=1.005
Urobilinogen, UA: 0.2
pH, UA: 6

## 2014-04-25 MED ORDER — TAMSULOSIN HCL 0.4 MG PO CAPS: 0.4000 mg | ORAL_CAPSULE | Freq: Every day | ORAL | Status: DC | PRN

## 2014-04-25 MED ORDER — SULFAMETHOXAZOLE-TMP DS 800-160 MG PO TABS: 1.0000 | ORAL_TABLET | Freq: Two times a day (BID) | ORAL | Status: DC

## 2014-04-25 NOTE — Patient Instructions (Signed)
You have prostate infection treat with bactrim DS 1 tablet twice daily for 3 weeks. Let us know if symptoms persist after treatment or if any new or worsening symptoms (like worse pain or trouble voiding or fever >101) Push fluids and rest. May also try flomax to help with stream.  Prostatitis The prostate gland is about the size and shape of a walnut. It is located just below your bladder. It produces one of the components of semen, which is made up of sperm and the fluids that help nourish and transport it out from the testicles. Prostatitis is inflammation of the prostate gland.  There are four types of prostatitis:  Acute bacterial prostatitis. This is the least common type of prostatitis. It starts quickly and usually is associated with a bladder infection, high fever, and shaking chills. It can occur at any age.  Chronic bacterial prostatitis. This is a persistent bacterial infection in the prostate. It usually develops from repeated acute bacterial prostatitis or acute bacterial prostatitis that was not properly treated. It can occur in men of any age but is most common in middle-aged men whose prostate has begun to enlarge. The symptoms are not as severe as those in acute bacterial prostatitis. Discomfort in the part of your body that is in front of your rectum and below your scrotum (perineum), lower abdomen, or in the head of your penis (glans) may represent your primary discomfort.  Chronic prostatitis (nonbacterial). This is the most common type of prostatitis. It is inflammation of the prostate gland that is not caused by a bacterial infection. The cause is unknown and may be associated with a viral infection or autoimmune disorder.  Prostatodynia (pelvic floor disorder). This is associated with increased muscular tone in the pelvis surrounding the prostate. CAUSES The causes of bacterial prostatitis are bacterial infection. The causes of the other types of prostatitis are unknown.    SYMPTOMS  Symptoms can vary depending upon the type of prostatitis that exists. There can also be overlap in symptoms. Possible symptoms for each type of prostatitis are listed below. Acute Bacterial Prostatitis  Painful urination.  Fever or chills.  Muscle or joint pains.  Low back pain.  Low abdominal pain.  Inability to empty bladder completely. Chronic Bacterial Prostatitis, Chronic Nonbacterial Prostatitis, and Prostatodynia  Sudden urge to urinate.  Frequent urination.  Difficulty starting urine stream.  Weak urine stream.  Discharge from the urethra.  Dribbling after urination.  Rectal pain.  Pain in the testicles, penis, or tip of the penis.  Pain in the perineum.  Problems with sexual function.  Painful ejaculation.  Bloody semen. DIAGNOSIS  In order to diagnose prostatitis, your health care provider will ask about your symptoms. One or more urine samples will be taken and tested (urinalysis). If the urinalysis result is negative for bacteria, your health care provider may use a finger to feel your prostate (digital rectal exam). This exam helps your health care provider determine if your prostate is swollen and tender. It will also produce a specimen of semen that can be analyzed. TREATMENT  Treatment for prostatitis depends on the cause. If a bacterial infection is the cause, it can be treated with antibiotic medicine. In cases of chronic bacterial prostatitis, the use of antibiotics for up to 1 month or 6 weeks may be necessary. Your health care provider may instruct you to take sitz baths to help relieve pain. A sitz bath is a bath of hot water in which your hips and buttocks  are under water. This relaxes the pelvic floor muscles and often helps to relieve the pressure on your prostate. HOME CARE INSTRUCTIONS   Take all medicines as directed by your health care provider.  Take sitz baths as directed by your health care provider. SEEK MEDICAL CARE IF:    Your symptoms get worse, not better.  You have a fever. SEEK IMMEDIATE MEDICAL CARE IF:   You have chills.  You feel nauseous or vomit.  You feel lightheaded or faint.  You are unable to urinate.  You have blood or blood clots in your urine. MAKE SURE YOU:  Understand these instructions.  Will watch your condition.  Will get help right away if you are not doing well or get worse. Document Released: 09/23/2000 Document Revised: 10/01/2013 Document Reviewed: 04/15/2013 [facility] Patient Information 2015 [place], [place]. This information is not intended to replace advice given to you by your health care provider. Make sure you discuss any questions you have with your health care provider.

## 2014-04-25 NOTE — Progress Notes (Signed)
   BP 124/82  Pulse 80  Temp(Src) 98.1 F (36.7 C) (Oral)  Wt 209 lb 4 oz (94.915 kg)   CC: urinary sxs  Subjective:    Patient ID: Marc Bolton, male    DOB: 16-Aug-1956, 58 y.o.   MRN: [id number]  HPI: Marc Bolton is a 58 y.o. male presenting on 04/25/2014 for Urinary Frequency   Noticing increased frequency over last year. In last week, noticing sudden decreased flow, suprapubic discomfort, daytime frequency and nocturia. Mild discomfort with urination. + increased urgency.  No back pain, no rectal pain. No fevers/chills, nausea, flank pain. No hematuria. No h/o prostate infection. Presumed BPH despite overall normal exam last year.  Lab Results  Component Value Date   PSA 0.52 10/13/2006    Relevant past medical, surgical, family and social history reviewed and updated as indicated.  Allergies and medications reviewed and updated. Current Outpatient Prescriptions on File Prior to Visit  Medication Sig  . aspirin 81 MG tablet Take 81 mg by mouth daily.  . Multiple Vitamin (MULTIVITAMIN) tablet Take 1 tablet by mouth daily.  . Calcium Carb-Cholecalciferol 1000-800 MG-UNIT TABS Take 1 tablet by mouth daily.   No current facility-administered medications on file prior to visit.    Review of Systems Per HPI unless specifically indicated above    Objective:    BP 124/82  Pulse 80  Temp(Src) 98.1 F (36.7 C) (Oral)  Wt 209 lb 4 oz (94.915 kg)  Physical Exam  Nursing note and vitals reviewed. Constitutional: He appears well-developed and well-nourished. No distress.  Abdominal: Soft. Normal appearance and bowel sounds are normal. He exhibits no mass. There is no hepatosplenomegaly. There is no tenderness. There is no rigidity, no rebound, no guarding, no CVA tenderness and negative Murphy's sign.  Genitourinary: Rectum normal. Rectal exam shows no external hemorrhoid, no internal hemorrhoid, no fissure, no mass, no tenderness and anal tone normal. Prostate is enlarged (40gm,  indurated) and tender.   Results for orders placed in visit on 04/25/14  POCT URINALYSIS DIPSTICK      Result Value Ref Range   Color, UA Yellow     Clarity, UA Hazy     Glucose, UA Negative     Bilirubin, UA Negative     Ketones, UA Negative     Spec Grav, UA <=1.005     Blood, UA 1+     pH, UA 6.0     Protein, UA Negative     Urobilinogen, UA 0.2     Nitrite, UA Negative     Leukocytes, UA Negative        Assessment & Plan:   Problem List Items Addressed This Visit   Acute prostatitis - Primary     Exam and story consistent with acute prostatitis.  UA/micro consistent with infection. UCx sent. Treat with 3wk course of bactrim DS 1 bid. Push fluids. May use flomax prn voiding difficulty. Emphasized importance of notifying us if sxs persist despite treatment or if any worsening of sxs.    Relevant Orders      Urine culture    Other Visit Diagnoses   Urine frequency        Relevant Orders       POCT Urinalysis Dipstick (Completed)        Follow up plan: Return if symptoms worsen or fail to improve.

## 2014-04-25 NOTE — Progress Notes (Signed)
Pre visit review using our clinic review tool, if applicable. No additional management support is needed unless otherwise documented below in the visit note. 

## 2014-04-25 NOTE — Assessment & Plan Note (Signed)
Exam and story consistent with acute prostatitis.  UA/micro consistent with infection. UCx sent. Treat with 3wk course of bactrim DS 1 bid. Push fluids. May use flomax prn voiding difficulty. Emphasized importance of notifying us if sxs persist despite treatment or if any worsening of sxs.

## 2014-04-26 ENCOUNTER — Telehealth: Payer: Self-pay | Admitting: Family Medicine

## 2014-04-26 LAB — URINE CULTURE
COLONY COUNT: NO GROWTH
ORGANISM ID, BACTERIA: NO GROWTH

## 2014-04-26 NOTE — Telephone Encounter (Signed)
Relevant patient education assigned to patient using Emmi. ° °

## 2014-11-06 ENCOUNTER — Telehealth: Payer: Self-pay | Admitting: Family Medicine

## 2014-11-06 DIAGNOSIS — Z125 Encounter for screening for malignant neoplasm of prostate: Secondary | ICD-10-CM

## 2014-11-06 DIAGNOSIS — E785 Hyperlipidemia, unspecified: Secondary | ICD-10-CM

## 2014-11-06 NOTE — Telephone Encounter (Signed)
Pt called to schedule cpx 12/09/14  Needs lab order sent to [facility] on [address]

## 2014-11-06 NOTE — Telephone Encounter (Signed)
Patient notified that [facility] had an interface with [place] and that orders would be in his chart. He verbalized understanding. Please order CPE labs for him. Thanks!

## 2014-11-06 NOTE — Telephone Encounter (Signed)
Ordered via [facility]

## 2014-11-17 ENCOUNTER — Encounter: Payer: Self-pay | Admitting: Family Medicine

## 2014-11-17 ENCOUNTER — Ambulatory Visit (INDEPENDENT_AMBULATORY_CARE_PROVIDER_SITE_OTHER): Payer: 59 | Admitting: Family Medicine

## 2014-11-17 VITALS — BP 124/80 | HR 72 | Temp 97.7°F | Wt 213.5 lb

## 2014-11-17 DIAGNOSIS — R229 Localized swelling, mass and lump, unspecified: Secondary | ICD-10-CM

## 2014-11-17 MED ORDER — DOXYCYCLINE HYCLATE 100 MG PO CAPS: 100.0000 mg | ORAL_CAPSULE | Freq: Two times a day (BID) | ORAL | Status: DC

## 2014-11-17 NOTE — Progress Notes (Addendum)
BP 124/80 mmHg  Pulse 72  Temp(Src) 97.7 F (36.5 C) (Oral)  Wt 213 lb 8 oz (96.843 kg)   CC: check knot in private area Subjective:    Patient ID: Marc Bolton, male    DOB: 07-23-1956, 59 y.o.   MRN: [id number]  HPI: Marc Bolton is a 59 y.o. male presenting on 11/17/2014 for Knot in umbilical area   4d ago noticed swelling/knot above penis. Tender to touch. No redness or warmth. No prior h/o of this.   No fevers/chills, nausea/vomiting, bowel changes. No h/o hernias in the past. No urinary symptoms, urgency, dysuria, frequency, urethral discharge.   5 mo ago did move heavy lawnmower up ramp but doesn't remember any straining. Otherwise denies recent heavy lifting or heavy straining.   Relevant past medical, surgical, family and social history reviewed and updated as indicated. Interim medical history since our last visit reviewed. Allergies and medications reviewed and updated. Current Outpatient Prescriptions on File Prior to Visit  Medication Sig  . aspirin 81 MG tablet Take 81 mg by mouth daily.  . Calcium Carb-Cholecalciferol 1000-800 MG-UNIT TABS Take 1 tablet by mouth daily.  . Multiple Vitamin (MULTIVITAMIN) tablet Take 1 tablet by mouth daily.   No current facility-administered medications on file prior to visit.    Review of Systems Per HPI unless specifically indicated above     Objective:    BP 124/80 mmHg  Pulse 72  Temp(Src) 97.7 F (36.5 C) (Oral)  Wt 213 lb 8 oz (96.843 kg)  Wt Readings from Last 3 Encounters:  11/17/14 213 lb 8 oz (96.843 kg)  04/25/14 209 lb 4 oz (94.915 kg)  07/19/13 213 lb 8 oz (96.843 kg)    Physical Exam  Constitutional: He appears well-developed and well-nourished.  Abdominal: Soft. Normal appearance and bowel sounds are normal. He exhibits no distension and no mass. There is no hepatosplenomegaly. There is no tenderness. There is no rigidity, no rebound, no guarding and negative Murphy's sign. No hernia. Hernia confirmed  negative in the ventral area, confirmed negative in the right inguinal area and confirmed negative in the left inguinal area.  Genitourinary: Testes normal and penis normal.    Right testis shows no mass, no swelling and no tenderness. Right testis is descended. Left testis shows no mass, no swelling and no tenderness. Left testis is descended. Circumcised. No penile tenderness. No discharge found.  Tender soft tissue induration at dorsal base of penis without erythema or break in skin noted. No inguinal LAD, no hernia appreciated.  Musculoskeletal: He exhibits no edema.  Lymphadenopathy:       Head (right side): No submental, no submandibular, no tonsillar, no preauricular and no posterior auricular adenopathy present.       Head (left side): No submental, no submandibular, no tonsillar, no preauricular and no posterior auricular adenopathy present.    He has no cervical adenopathy.    He has no axillary adenopathy.       Right axillary: No lateral adenopathy present.       Left axillary: No lateral adenopathy present.      Right: No inguinal and no supraclavicular adenopathy present.       Left: No inguinal and no supraclavicular adenopathy present.  Nursing note and vitals reviewed.      Assessment & Plan:   Problem List Items Addressed This Visit    Soft tissue swelling - Primary    Unclear etiology. ?presymphyseal lymphadenopathy but a bit superficial for this. No other  LAD. Testicular exam normal. Will treat as presumed infection/cellulitis with doxy course x 10 days. If not improved with this, low threshold for urological referral for further evaluation. Pt agrees with plan. Check CBC today.      Relevant Orders   CBC with Differential/Platelet       Follow up plan: Return if symptoms worsen or fail to improve.

## 2014-11-17 NOTE — Patient Instructions (Signed)
I would treat as infection with doxycycline course. If not better with this, let me know for referral to urologist. Blood work today.

## 2014-11-17 NOTE — Assessment & Plan Note (Signed)
Unclear etiology. ?presymphyseal lymphadenopathy but a bit superficial for this. No other LAD. Testicular exam normal. Will treat as presumed infection/cellulitis with doxy course x 10 days. If not improved with this, low threshold for urological referral for further evaluation. Pt agrees with plan. Check CBC today.

## 2014-11-17 NOTE — Progress Notes (Signed)
Pre visit review using our clinic review tool, if applicable. No additional management support is needed unless otherwise documented below in the visit note. 

## 2014-11-18 LAB — CBC WITH DIFFERENTIAL/PLATELET
BASOS ABS: 0.1 10*3/uL (ref 0.0–0.1)
Basophils Relative: 1 % (ref 0.0–3.0)
Eosinophils Absolute: 0.1 10*3/uL (ref 0.0–0.7)
Eosinophils Relative: 1.5 % (ref 0.0–5.0)
HCT: 44.6 % (ref 39.0–52.0)
HEMOGLOBIN: 15.1 g/dL (ref 13.0–17.0)
Lymphocytes Relative: 25.9 % (ref 12.0–46.0)
Lymphs Abs: 1.5 10*3/uL (ref 0.7–4.0)
MCHC: 33.8 g/dL (ref 30.0–36.0)
MCV: 92.2 fl (ref 78.0–100.0)
MONO ABS: 0.4 10*3/uL (ref 0.1–1.0)
Monocytes Relative: 7.7 % (ref 3.0–12.0)
NEUTROS ABS: 3.6 10*3/uL (ref 1.4–7.7)
Neutrophils Relative %: 63.9 % (ref 43.0–77.0)
Platelets: 249 10*3/uL (ref 150.0–400.0)
RBC: 4.84 Mil/uL (ref 4.22–5.81)
RDW: 13.7 % (ref 11.5–15.5)
WBC: 5.7 10*3/uL (ref 4.0–10.5)

## 2014-11-27 ENCOUNTER — Telehealth: Payer: Self-pay | Admitting: Family Medicine

## 2014-11-27 DIAGNOSIS — E785 Hyperlipidemia, unspecified: Secondary | ICD-10-CM

## 2014-11-27 DIAGNOSIS — Z125 Encounter for screening for malignant neoplasm of prostate: Secondary | ICD-10-CM

## 2014-11-27 NOTE — Telephone Encounter (Signed)
Pt called to update you since his last visit  The knot is no longer sensitive and has decreased in size but still present Today is the last day of his antibiotic Pt wanted to know what to do next  Please advise

## 2014-11-27 NOTE — Telephone Encounter (Signed)
Recommend continued warm compresses twice daily over next 2 weeks and monitor for improvement with this - update [place] in another 2 weeks with effect of warm compresses

## 2014-11-28 NOTE — Telephone Encounter (Signed)
Pt called back to see what dr g recommend. i read dr g note to pt   He will try this and give a call back with an update

## 2014-12-02 ENCOUNTER — Telehealth: Payer: Self-pay

## 2014-12-02 NOTE — Telephone Encounter (Signed)
Pt request future lab test prior to CPX be released to [facility] so when pt goes to [facility] on Sat pt can have lab tested. Spoke with Aniceto Boss and she will release orders in chart to [facility] now. Pt notified done.

## 2014-12-02 NOTE — Addendum Note (Signed)
Addended by: Marchia Bond on: 12/02/2014 02:37 PM   Modules accepted: Orders

## 2014-12-04 ENCOUNTER — Other Ambulatory Visit (INDEPENDENT_AMBULATORY_CARE_PROVIDER_SITE_OTHER): Payer: 59

## 2014-12-04 DIAGNOSIS — E785 Hyperlipidemia, unspecified: Secondary | ICD-10-CM

## 2014-12-04 DIAGNOSIS — Z125 Encounter for screening for malignant neoplasm of prostate: Secondary | ICD-10-CM

## 2014-12-05 LAB — LIPID PANEL
CHOL/HDL RATIO: 3.2 ratio (ref 0.0–5.0)
Cholesterol, Total: 206 mg/dL — ABNORMAL HIGH (ref 100–199)
HDL: 64 mg/dL (ref >39)
LDL Calculated: 129 mg/dL — ABNORMAL HIGH (ref 0–99)
TRIGLYCERIDES: 67 mg/dL (ref 0–149)
VLDL Cholesterol Cal: 13 mg/dL (ref 5–40)

## 2014-12-05 LAB — COMPREHENSIVE METABOLIC PANEL
A/G RATIO: 1.7 (ref 1.1–2.5)
ALT: 21 IU/L (ref 0–44)
AST: 21 IU/L (ref 0–40)
Albumin: 4.3 g/dL (ref 3.5–5.5)
Alkaline Phosphatase: 59 IU/L (ref 39–117)
BUN/Creatinine Ratio: 13 (ref 9–20)
BUN: 14 mg/dL (ref 6–24)
Bilirubin Total: 0.9 mg/dL (ref 0.0–1.2)
CALCIUM: 9.5 mg/dL (ref 8.7–10.2)
CO2: 25 mmol/L (ref 18–29)
Chloride: 105 mmol/L (ref 97–108)
Creatinine, Ser: 1.09 mg/dL (ref 0.76–1.27)
GFR calc non Af Amer: 74 mL/min/{1.73_m2} (ref >59)
GFR, EST AFRICAN AMERICAN: 86 mL/min/{1.73_m2} (ref >59)
Globulin, Total: 2.5 g/dL (ref 1.5–4.5)
Glucose: 102 mg/dL — ABNORMAL HIGH (ref 65–99)
POTASSIUM: 4.3 mmol/L (ref 3.5–5.2)
SODIUM: 145 mmol/L — AB (ref 134–144)
Total Protein: 6.8 g/dL (ref 6.0–8.5)

## 2014-12-05 LAB — PSA: PSA: 0.6 ng/mL (ref 0.0–4.0)

## 2014-12-05 LAB — TSH: TSH: 2.08 u[IU]/mL (ref 0.450–4.500)

## 2014-12-05 LAB — SPECIMEN STATUS REPORT

## 2014-12-09 ENCOUNTER — Encounter: Payer: Self-pay | Admitting: Family Medicine

## 2014-12-09 ENCOUNTER — Ambulatory Visit (INDEPENDENT_AMBULATORY_CARE_PROVIDER_SITE_OTHER): Payer: 59 | Admitting: Family Medicine

## 2014-12-09 VITALS — BP 118/72 | HR 62 | Temp 97.9°F | Ht 71.0 in | Wt 211.8 lb

## 2014-12-09 DIAGNOSIS — Z0000 Encounter for general adult medical examination without abnormal findings: Secondary | ICD-10-CM

## 2014-12-09 DIAGNOSIS — R229 Localized swelling, mass and lump, unspecified: Secondary | ICD-10-CM

## 2014-12-09 DIAGNOSIS — E785 Hyperlipidemia, unspecified: Secondary | ICD-10-CM

## 2014-12-09 DIAGNOSIS — R350 Frequency of micturition: Secondary | ICD-10-CM

## 2014-12-09 MED ORDER — TAMSULOSIN HCL 0.4 MG PO CAPS: 0.4000 mg | ORAL_CAPSULE | Freq: Every day | ORAL | Status: DC

## 2014-12-09 NOTE — Progress Notes (Addendum)
BP 118/72 mmHg  Pulse 62  Temp(Src) 97.9 F (36.6 C) (Oral)  Ht 5\' 11"  (1.803 m)  Wt 211 lb 12 oz (96.049 kg)  BMI 29.55 kg/m2   CC: CPE  Subjective:    Patient ID: Marc Bolton, male    DOB: [birth year], 59 y.o.   MRN: [id number]  HPI: Marc Bolton is a 59 y.o. male presenting on 12/09/2014 for Annual Exam   Cellulitis has cleared up after doxycycline.  Has had trouble getting labs at [facility] - requests written Rx each time.  Urinary urgency without incontinence or accidents. No upcoming eye procedures. Recent treatment with bactrim and doxy - both times helped these sxs.   Preventative: Colonoscopy - 01/2012, cecal erosion, tubular adenoma, diverticulosis, rec rpt 3 yrs Ardis Hughs) Prostate - gets checked yearly. Nocturia - several times a night. Normal stream.  Flu - at local pharmacy Tdap - 2013 Shingles shot - 10/2013 Seat belt use discussed Sunscreen use discussed. No changing moles.  Caffeine: 3 cups coffee/day  Married; Lives with wife  3 children  Data processor for [employer] Activity: works out 3x/wk, runs 15-20 miles/wk Diet: good water, fruits/vegetables daily, very little fried food/fatty food, fish weekly  Relevant past medical, surgical, family and social history reviewed and updated as indicated. Interim medical history since our last visit reviewed. Allergies and medications reviewed and updated. Current Outpatient Prescriptions on File Prior to Visit  Medication Sig  . aspirin 81 MG tablet Take 81 mg by mouth daily.  . Calcium Carb-Cholecalciferol 1000-800 MG-UNIT TABS Take 1 tablet by mouth daily.  . Multiple Vitamin (MULTIVITAMIN) tablet Take 1 tablet by mouth daily.   No current facility-administered medications on file prior to visit.    Review of Systems  Constitutional: Negative for fever, chills, activity change, appetite change, fatigue and unexpected weight change.  HENT: Negative for hearing loss.   Eyes: Negative for visual disturbance.    Respiratory: Negative for cough, chest tightness, shortness of breath and wheezing.   Cardiovascular: Negative for chest pain, palpitations and leg swelling.  Gastrointestinal: Negative for nausea, vomiting, abdominal pain, diarrhea, constipation, blood in stool and abdominal distention.  Genitourinary: Negative for hematuria and difficulty urinating.  Musculoskeletal: Negative for myalgias, arthralgias and neck pain.  Skin: Negative for rash.  Neurological: Negative for dizziness, seizures, syncope and headaches.  Hematological: Negative for adenopathy. Does not bruise/bleed easily.  Psychiatric/Behavioral: Negative for dysphoric mood. The patient is not nervous/anxious.    Per HPI unless specifically indicated above     Objective:    BP 118/72 mmHg  Pulse 62  Temp(Src) 97.9 F (36.6 C) (Oral)  Ht 5\' 11"  (1.803 m)  Wt 211 lb 12 oz (96.049 kg)  BMI 29.55 kg/m2  Wt Readings from Last 3 Encounters:  12/09/14 211 lb 12 oz (96.049 kg)  11/17/14 213 lb 8 oz (96.843 kg)  04/25/14 209 lb 4 oz (94.915 kg)    Physical Exam  Constitutional: He is oriented to person, place, and time. He appears well-developed and well-nourished. No distress.  HENT:  Head: Normocephalic and atraumatic.  Right Ear: Hearing, tympanic membrane, external ear and ear canal normal.  Left Ear: Hearing, tympanic membrane, external ear and ear canal normal.  Nose: Nose normal.  Mouth/Throat: Uvula is midline, oropharynx is clear and moist and mucous membranes are normal. No oropharyngeal exudate, posterior oropharyngeal edema or posterior oropharyngeal erythema.  Eyes: Conjunctivae and EOM are normal. Pupils are equal, round, and reactive to light. No scleral icterus.  Neck:  Normal range of motion. Neck supple. Carotid bruit is not present. No thyromegaly present.  Cardiovascular: Normal rate, regular rhythm, normal heart sounds and intact distal pulses.   No murmur heard. Pulses:      Radial pulses are 2+ on  the right side, and 2+ on the left side.  Pulmonary/Chest: Effort normal and breath sounds normal. No respiratory distress. He has no wheezes. He has no rales.  Abdominal: Soft. Bowel sounds are normal. He exhibits no distension and no mass. There is no tenderness. There is no rebound and no guarding.  Genitourinary: Rectum normal and prostate normal. Rectal exam shows no external hemorrhoid, no internal hemorrhoid, no fissure, no mass, no tenderness and anal tone normal. Prostate is not enlarged (15gm) and not tender.  Musculoskeletal: Normal range of motion. He exhibits no edema.  Lymphadenopathy:    He has no cervical adenopathy.  Neurological: He is alert and oriented to person, place, and time.  CN grossly intact, station and gait intact  Skin: Skin is warm and dry. No rash noted.  Psychiatric: He has a normal mood and affect. His behavior is normal. Judgment and thought content normal.  Nursing note and vitals reviewed.  Results for orders placed or performed in visit on 12/04/14  Lipid panel  Result Value Ref Range   Cholesterol, Total 206 (H) 100 - 199 mg/dL   Triglycerides 67 0 - 149 mg/dL   HDL 64 >39 mg/dL   VLDL Cholesterol Cal 13 5 - 40 mg/dL   LDL Calculated 129 (H) 0 - 99 mg/dL   Chol/HDL Ratio 3.2 0.0 - 5.0 ratio units  Comprehensive metabolic panel  Result Value Ref Range   Glucose 102 (H) 65 - 99 mg/dL   BUN 14 6 - 24 mg/dL   Creatinine, Ser 1.09 0.76 - 1.27 mg/dL   GFR calc non Af Amer 74 >59 mL/min/1.73   GFR calc Af Amer 86 >59 mL/min/1.73   BUN/Creatinine Ratio 13 9 - 20   Sodium 145 (H) 134 - 144 mmol/L   Potassium 4.3 3.5 - 5.2 mmol/L   Chloride 105 97 - 108 mmol/L   CO2 25 18 - 29 mmol/L   Calcium 9.5 8.7 - 10.2 mg/dL   Total Protein 6.8 6.0 - 8.5 g/dL   Albumin 4.3 3.5 - 5.5 g/dL   Globulin, Total 2.5 1.5 - 4.5 g/dL   Albumin/Globulin Ratio 1.7 1.1 - 2.5   Bilirubin Total 0.9 0.0 - 1.2 mg/dL   Alkaline Phosphatase 59 39 - 117 IU/L   AST 21 0 - 40  IU/L   ALT 21 0 - 44 IU/L  PSA  Result Value Ref Range   PSA 0.6 0.0 - 4.0 ng/mL  TSH  Result Value Ref Range   TSH 2.080 0.450 - 4.500 uIU/mL  Specimen status report  Result Value Ref Range   specimen status report Comment       Assessment & Plan:   Problem List Items Addressed This Visit    RESOLVED: Soft tissue swelling    Largely resolved.      Increased frequency of urination    Anticipate BPH related. Start flomax.       HLD (hyperlipidemia)    Minimal off meds.      Healthcare maintenance - Primary    Preventative protocols reviewed and updated unless pt declined. Discussed healthy diet and lifestyle.           Follow up plan: Return in about 1 year (around 12/09/2015),  or as needed, for annual exam, prior fasting for blood work.

## 2014-12-09 NOTE — Assessment & Plan Note (Addendum)
Largely resolved.  

## 2014-12-09 NOTE — Addendum Note (Signed)
Addended by: Ria Bush on: 12/09/2014 09:05 AM   Modules accepted: Miquel Dunn

## 2014-12-09 NOTE — Assessment & Plan Note (Signed)
Anticipate BPH related. Start flomax.

## 2014-12-09 NOTE — Assessment & Plan Note (Signed)
Minimal off meds.

## 2014-12-09 NOTE — Patient Instructions (Addendum)
Call GI to inquire about repeat colonoscopy (you seem to be due this year). Trial flomax for urinary frequency. Sent to pharmacy. Return as needed or in 1 year for next physical.

## 2014-12-09 NOTE — Assessment & Plan Note (Signed)
Preventative protocols reviewed and updated unless pt declined. Discussed healthy diet and lifestyle.  

## 2014-12-09 NOTE — Progress Notes (Signed)
Pre visit review using our clinic review tool, if applicable. No additional management support is needed unless otherwise documented below in the visit note. 

## 2014-12-10 ENCOUNTER — Encounter: Payer: Self-pay | Admitting: Family Medicine

## 2014-12-10 NOTE — Telephone Encounter (Signed)
See Mychart message from patient.

## 2015-01-06 ENCOUNTER — Other Ambulatory Visit: Payer: Self-pay | Admitting: *Deleted

## 2015-01-06 MED ORDER — TAMSULOSIN HCL 0.4 MG PO CAPS: 0.4000 mg | ORAL_CAPSULE | Freq: Every day | ORAL | Status: DC

## 2015-01-20 ENCOUNTER — Encounter: Payer: Self-pay | Admitting: Gastroenterology

## 2015-02-25 ENCOUNTER — Other Ambulatory Visit: Payer: Self-pay | Admitting: Family Medicine

## 2015-06-30 ENCOUNTER — Encounter: Payer: Self-pay | Admitting: Gastroenterology

## 2015-07-28 ENCOUNTER — Encounter: Payer: Self-pay | Admitting: Gastroenterology

## 2015-08-22 ENCOUNTER — Emergency Department: Payer: 59

## 2015-08-22 ENCOUNTER — Encounter: Payer: Self-pay | Admitting: Emergency Medicine

## 2015-08-22 ENCOUNTER — Observation Stay
Admission: EM | Admit: 2015-08-22 | Discharge: 2015-08-25 | Disposition: A | Discharge status: Home / Self Care | Payer: 59 | Attending: Surgery | Admitting: Surgery

## 2015-08-22 DIAGNOSIS — R1030 Lower abdominal pain, unspecified: Secondary | ICD-10-CM | POA: Diagnosis not present

## 2015-08-22 DIAGNOSIS — K819 Cholecystitis, unspecified: Secondary | ICD-10-CM | POA: Diagnosis present

## 2015-08-22 DIAGNOSIS — Z825 Family history of asthma and other chronic lower respiratory diseases: Secondary | ICD-10-CM | POA: Diagnosis not present

## 2015-08-22 DIAGNOSIS — K921 Melena: Secondary | ICD-10-CM | POA: Insufficient documentation

## 2015-08-22 DIAGNOSIS — K8020 Calculus of gallbladder without cholecystitis without obstruction: Secondary | ICD-10-CM | POA: Diagnosis present

## 2015-08-22 DIAGNOSIS — K5900 Constipation, unspecified: Secondary | ICD-10-CM | POA: Insufficient documentation

## 2015-08-22 DIAGNOSIS — Z8249 Family history of ischemic heart disease and other diseases of the circulatory system: Secondary | ICD-10-CM | POA: Diagnosis not present

## 2015-08-22 DIAGNOSIS — N410 Acute prostatitis: Secondary | ICD-10-CM | POA: Insufficient documentation

## 2015-08-22 DIAGNOSIS — R100 Acute abdomen: Secondary | ICD-10-CM

## 2015-08-22 DIAGNOSIS — K821 Hydrops of gallbladder: Secondary | ICD-10-CM | POA: Insufficient documentation

## 2015-08-22 DIAGNOSIS — Z7982 Long term (current) use of aspirin: Secondary | ICD-10-CM | POA: Diagnosis not present

## 2015-08-22 DIAGNOSIS — K8000 Calculus of gallbladder with acute cholecystitis without obstruction: Principal | ICD-10-CM | POA: Insufficient documentation

## 2015-08-22 DIAGNOSIS — Z833 Family history of diabetes mellitus: Secondary | ICD-10-CM | POA: Insufficient documentation

## 2015-08-22 DIAGNOSIS — E785 Hyperlipidemia, unspecified: Secondary | ICD-10-CM | POA: Diagnosis not present

## 2015-08-22 DIAGNOSIS — M545 Low back pain: Secondary | ICD-10-CM | POA: Insufficient documentation

## 2015-08-22 DIAGNOSIS — K8001 Calculus of gallbladder with acute cholecystitis with obstruction: Secondary | ICD-10-CM | POA: Insufficient documentation

## 2015-08-22 DIAGNOSIS — K4020 Bilateral inguinal hernia, without obstruction or gangrene, not specified as recurrent: Secondary | ICD-10-CM | POA: Insufficient documentation

## 2015-08-22 DIAGNOSIS — K7689 Other specified diseases of liver: Secondary | ICD-10-CM | POA: Diagnosis not present

## 2015-08-22 DIAGNOSIS — Z79899 Other long term (current) drug therapy: Secondary | ICD-10-CM | POA: Diagnosis not present

## 2015-08-22 DIAGNOSIS — J9811 Atelectasis: Secondary | ICD-10-CM | POA: Diagnosis not present

## 2015-08-22 DIAGNOSIS — R109 Unspecified abdominal pain: Secondary | ICD-10-CM | POA: Insufficient documentation

## 2015-08-22 LAB — CBC WITH DIFFERENTIAL/PLATELET
Basophils Absolute: 0.1 10*3/uL (ref 0–0.1)
Basophils Relative: 0 %
Eosinophils Absolute: 0 10*3/uL (ref 0–0.7)
Eosinophils Relative: 0 %
HEMATOCRIT: 47.8 % (ref 40.0–52.0)
Hemoglobin: 16 g/dL (ref 13.0–18.0)
LYMPHS ABS: 1.5 10*3/uL (ref 1.0–3.6)
LYMPHS PCT: 11 %
MCH: 30.9 pg (ref 26.0–34.0)
MCHC: 33.5 g/dL (ref 32.0–36.0)
MCV: 92.3 fL (ref 80.0–100.0)
MONO ABS: 1.1 10*3/uL — AB (ref 0.2–1.0)
MONOS PCT: 8 %
NEUTROS ABS: 11.1 10*3/uL — AB (ref 1.4–6.5)
Neutrophils Relative %: 81 %
Platelets: 260 10*3/uL (ref 150–440)
RBC: 5.17 MIL/uL (ref 4.40–5.90)
RDW: 13.7 % (ref 11.5–14.5)
WBC: 13.8 10*3/uL — ABNORMAL HIGH (ref 3.8–10.6)

## 2015-08-22 LAB — COMPREHENSIVE METABOLIC PANEL
ALT: 25 U/L (ref 17–63)
ANION GAP: 11 (ref 5–15)
AST: 25 U/L (ref 15–41)
Albumin: 4.5 g/dL (ref 3.5–5.0)
Alkaline Phosphatase: 54 U/L (ref 38–126)
BILIRUBIN TOTAL: 1 mg/dL (ref 0.3–1.2)
BUN: 11 mg/dL (ref 6–20)
CO2: 26 mmol/L (ref 22–32)
Calcium: 9.3 mg/dL (ref 8.9–10.3)
Chloride: 102 mmol/L (ref 101–111)
Creatinine, Ser: 0.95 mg/dL (ref 0.61–1.24)
GFR calc Af Amer: >60 mL/min (ref >60)
Glucose, Bld: 104 mg/dL — ABNORMAL HIGH (ref 65–99)
POTASSIUM: 3.7 mmol/L (ref 3.5–5.1)
Sodium: 139 mmol/L (ref 135–145)
TOTAL PROTEIN: 7.5 g/dL (ref 6.5–8.1)

## 2015-08-22 LAB — URINALYSIS COMPLETE WITH MICROSCOPIC (ARMC ONLY)
BILIRUBIN URINE: NEGATIVE
Bacteria, UA: NONE SEEN
GLUCOSE, UA: NEGATIVE mg/dL
LEUKOCYTES UA: NEGATIVE
Nitrite: NEGATIVE
Protein, ur: NEGATIVE mg/dL
SQUAMOUS EPITHELIAL / LPF: NONE SEEN
Specific Gravity, Urine: 1.014 (ref 1.005–1.030)
pH: 5 (ref 5.0–8.0)

## 2015-08-22 LAB — LIPASE, BLOOD: LIPASE: 24 U/L (ref 11–51)

## 2015-08-22 MED ORDER — HYDROMORPHONE HCL 1 MG/ML IJ SOLN
1.0000 mg | Freq: Once | INTRAMUSCULAR | Status: AC
  Administered 2015-08-22: 1 mg via INTRAVENOUS
  Filled 2015-08-22: qty 1

## 2015-08-22 MED ORDER — ONDANSETRON HCL 4 MG/2ML IJ SOLN
4.0000 mg | Freq: Four times a day (QID) | INTRAMUSCULAR | Status: DC | PRN
Start: 2015-08-22 — End: 2015-08-25

## 2015-08-22 MED ORDER — ZOLPIDEM TARTRATE 5 MG PO TABS
5.0000 mg | ORAL_TABLET | Freq: Every evening | ORAL | Status: DC | PRN
  Administered 2015-08-22 – 2015-08-24 (×3): 5 mg via ORAL
  Filled 2015-08-22 (×3): qty 1

## 2015-08-22 MED ORDER — DIPHENHYDRAMINE HCL 50 MG/ML IJ SOLN: 12.5000 mg | Freq: Four times a day (QID) | INTRAMUSCULAR | Status: DC | PRN

## 2015-08-22 MED ORDER — SODIUM CHLORIDE 0.9 % IV SOLN
Freq: Once | INTRAVENOUS | Status: AC
  Administered 2015-08-22: 18:00:00 via INTRAVENOUS

## 2015-08-22 MED ORDER — IOHEXOL 240 MG/ML SOLN
25.0000 mL | Freq: Once | INTRAMUSCULAR | Status: AC | PRN
  Administered 2015-08-22: 25 mL via ORAL

## 2015-08-22 MED ORDER — TAMSULOSIN HCL 0.4 MG PO CAPS
0.4000 mg | ORAL_CAPSULE | Freq: Every day | ORAL | Status: DC
  Administered 2015-08-23: 0.4 mg via ORAL
  Filled 2015-08-22: qty 1

## 2015-08-22 MED ORDER — HYDROMORPHONE HCL 1 MG/ML IJ SOLN
0.5000 mg | INTRAMUSCULAR | Status: DC | PRN
  Administered 2015-08-23 – 2015-08-24 (×4): 0.5 mg via INTRAVENOUS
  Filled 2015-08-22 (×4): qty 1

## 2015-08-22 MED ORDER — ONDANSETRON 4 MG PO TBDP: 4.0000 mg | ORAL_TABLET | Freq: Four times a day (QID) | ORAL | Status: DC | PRN

## 2015-08-22 MED ORDER — IOHEXOL 300 MG/ML  SOLN
100.0000 mL | Freq: Once | INTRAMUSCULAR | Status: AC | PRN
  Administered 2015-08-22: 100 mL via INTRAVENOUS

## 2015-08-22 MED ORDER — LACTATED RINGERS IV SOLN
INTRAVENOUS | Status: DC
  Administered 2015-08-22 – 2015-08-25 (×8): via INTRAVENOUS

## 2015-08-22 MED ORDER — HYDROMORPHONE HCL 1 MG/ML IJ SOLN
0.5000 mg | Freq: Once | INTRAMUSCULAR | Status: AC
  Administered 2015-08-22: 0.5 mg via INTRAVENOUS
  Filled 2015-08-22: qty 1

## 2015-08-22 MED ORDER — SIMETHICONE 80 MG PO CHEW
40.0000 mg | CHEWABLE_TABLET | Freq: Four times a day (QID) | ORAL | Status: DC | PRN
  Filled 2015-08-22: qty 1

## 2015-08-22 MED ORDER — KETOROLAC TROMETHAMINE 30 MG/ML IJ SOLN
15.0000 mg | Freq: Four times a day (QID) | INTRAMUSCULAR | Status: DC
  Administered 2015-08-22 – 2015-08-24 (×5): 15 mg via INTRAVENOUS
  Filled 2015-08-22 (×5): qty 1

## 2015-08-22 MED ORDER — ACETAMINOPHEN 650 MG RE SUPP: 650.0000 mg | Freq: Four times a day (QID) | RECTAL | Status: DC | PRN

## 2015-08-22 MED ORDER — ENOXAPARIN SODIUM 40 MG/0.4ML ~~LOC~~ SOLN
40.0000 mg | SUBCUTANEOUS | Status: DC
  Administered 2015-08-23: 40 mg via SUBCUTANEOUS
  Filled 2015-08-22 (×3): qty 0.4

## 2015-08-22 MED ORDER — ONDANSETRON HCL 4 MG/2ML IJ SOLN
4.0000 mg | Freq: Once | INTRAMUSCULAR | Status: AC
  Administered 2015-08-22: 4 mg via INTRAVENOUS
  Filled 2015-08-22: qty 2

## 2015-08-22 MED ORDER — PIPERACILLIN-TAZOBACTAM 3.375 G IVPB
3.3750 g | Freq: Three times a day (TID) | INTRAVENOUS | Status: DC
  Administered 2015-08-23 – 2015-08-24 (×4): 3.375 g via INTRAVENOUS
  Filled 2015-08-22 (×6): qty 50

## 2015-08-22 MED ORDER — DIPHENHYDRAMINE HCL 12.5 MG/5ML PO ELIX: 12.5000 mg | ORAL_SOLUTION | Freq: Four times a day (QID) | ORAL | Status: DC | PRN

## 2015-08-22 MED ORDER — PANTOPRAZOLE SODIUM 40 MG IV SOLR
40.0000 mg | Freq: Every day | INTRAVENOUS | Status: DC
  Administered 2015-08-22 – 2015-08-23 (×2): 40 mg via INTRAVENOUS
  Filled 2015-08-22 (×2): qty 40

## 2015-08-22 MED ORDER — ACETAMINOPHEN 325 MG PO TABS: 650.0000 mg | ORAL_TABLET | Freq: Four times a day (QID) | ORAL | Status: DC | PRN

## 2015-08-22 MED ORDER — PIPERACILLIN-TAZOBACTAM 3.375 G IVPB
3.3750 g | Freq: Once | INTRAVENOUS | Status: AC
  Administered 2015-08-22: 3.375 g via INTRAVENOUS
  Filled 2015-08-22: qty 50

## 2015-08-22 NOTE — H&P (Addendum)
Marc Bolton is an 59 y.o. male.   Chief Complaint: Lower abdominal pain HPI: 59 yr old with hx of HLD but otherwise healthy, comes in with abdominal pain.  Patient states around lunchtime yesterday he began having some midabdominal pain around umbilicus and more in lower abdomen.  Patient states he felt bloated as well.  States it eased up and he went to supper last night and had some steak and beer.  He states about 1 hr after eating he began have the pain again around the umbilicus and lower abdomen, states it is a sharp pain and pressure.  He got nauseated and had 1 bout of emesis as well.  He states that her was no blood in the emesis but that it did relieve the pressure somewhat.  He states that after that he took some Pepto, Tums and some laxative.  He was able to sleep and felt better this AM, then began to have the pain again. States that the pain built up and began having pain in epigastrium at that time.  He is also having some lower back pain, but states that is not unusual for him. Patient then came to the ED.  He states some chills this evening but no fever.  He had a normal stool yesterday AM, without any blood, but he did have bloody stool about 1 month ago.  He had a colonoscopy in 2013 with 3 TA polyps and after the blood in stool now has colonoscopy set up with [facility].  He denies any current fever, nausea, vomiting, dysuria, hematuria.    Past Medical History  Diagnosis Date  . HLD (hyperlipidemia)     no per pt    Past Surgical History  Procedure Laterality Date  . Septoplasty  1992  . Stress myoview  10/24/06    WNL except ST seg decrease inferiolaterally  . Colonoscopy  01/2012    polyps (tubular adenoma), cecal erosion, diverticulosis, rpt due 3-5 yrs    Family History  Problem Relation Age of Onset  . Emphysema Father   . Neuropathy Mother     severe peripheral  . Hypertension Sister   . Diabetes Sister   . Coronary artery disease Brother 77  . Stroke Neg Hx   .  Cancer Neg Hx   . Colon cancer Neg Hx   . Esophageal cancer Neg Hx   . Stomach cancer Neg Hx   . Rectal cancer Neg Hx    Social History:  reports that he has never smoked. He has never used smokeless tobacco. He reports that he drinks about 2.4 oz of alcohol per week. He reports that he does not use illicit drugs.  Allergies: No Known Allergies   (Not in a hospital admission)  Results for orders placed or performed during the hospital encounter of 08/22/15 (from the past 48 hour(s))  CBC WITH DIFFERENTIAL     Status: Abnormal   Collection Time: 08/22/15  4:57 PM  Result Value Ref Range   WBC 13.8 (H) 3.8 - 10.6 K/uL   RBC 5.17 4.40 - 5.90 MIL/uL   Hemoglobin 16.0 13.0 - 18.0 g/dL   HCT 47.8 40.0 - 52.0 %   MCV 92.3 80.0 - 100.0 fL   MCH 30.9 26.0 - 34.0 pg   MCHC 33.5 32.0 - 36.0 g/dL   RDW 13.7 11.5 - 14.5 %   Platelets 260 150 - 440 K/uL   Neutrophils Relative % 81 %   Neutro Abs 11.1 (H) 1.4 -  6.5 K/uL   Lymphocytes Relative 11 %   Lymphs Abs 1.5 1.0 - 3.6 K/uL   Monocytes Relative 8 %   Monocytes Absolute 1.1 (H) 0.2 - 1.0 K/uL   Eosinophils Relative 0 %   Eosinophils Absolute 0.0 0 - 0.7 K/uL   Basophils Relative 0 %   Basophils Absolute 0.1 0 - 0.1 K/uL  Comprehensive metabolic panel     Status: Abnormal   Collection Time: 08/22/15  4:57 PM  Result Value Ref Range   Sodium 139 135 - 145 mmol/L   Potassium 3.7 3.5 - 5.1 mmol/L   Chloride 102 101 - 111 mmol/L   CO2 26 22 - 32 mmol/L   Glucose, Bld 104 (H) 65 - 99 mg/dL   BUN 11 6 - 20 mg/dL   Creatinine, Ser 0.95 0.61 - 1.24 mg/dL   Calcium 9.3 8.9 - 10.3 mg/dL   Total Protein 7.5 6.5 - 8.1 g/dL   Albumin 4.5 3.5 - 5.0 g/dL   AST 25 15 - 41 U/L   ALT 25 17 - 63 U/L   Alkaline Phosphatase 54 38 - 126 U/L   Total Bilirubin 1.0 0.3 - 1.2 mg/dL   GFR calc non Af Amer >60 >60 mL/min   GFR calc Af Amer >60 >60 mL/min    Comment: (NOTE) The eGFR has been calculated using the CKD EPI equation. This calculation has  not been validated in all clinical situations. eGFR's persistently <60 mL/min signify possible Chronic Kidney Disease.    Anion gap 11 5 - 15  Lipase, blood     Status: None   Collection Time: 08/22/15  4:57 PM  Result Value Ref Range   Lipase 24 11 - 51 U/L  Urinalysis complete, with microscopic ([facility] only)     Status: Abnormal   Collection Time: 08/22/15  6:01 PM  Result Value Ref Range   Color, Urine YELLOW (A) YELLOW   APPearance CLEAR (A) CLEAR   Glucose, UA NEGATIVE NEGATIVE mg/dL   Bilirubin Urine NEGATIVE NEGATIVE   Ketones, ur TRACE (A) NEGATIVE mg/dL   Specific Gravity, Urine 1.014 1.005 - 1.030   Hgb urine dipstick 1+ (A) NEGATIVE   pH 5.0 5.0 - 8.0   Protein, ur NEGATIVE NEGATIVE mg/dL   Nitrite NEGATIVE NEGATIVE   Leukocytes, UA NEGATIVE NEGATIVE   RBC / HPF 0-5 0 - 5 RBC/hpf   WBC, UA 0-5 0 - 5 WBC/hpf   Bacteria, UA NONE SEEN NONE SEEN   Squamous Epithelial / LPF NONE SEEN NONE SEEN   Mucous PRESENT    Ct Abdomen Pelvis W Contrast  08/22/2015  CLINICAL DATA:  Sharp lower abdominal pain for 2 days, today the pain is radiating to upper quadrants. Nausea and vomiting, constipation. EXAM: CT ABDOMEN AND PELVIS WITH CONTRAST TECHNIQUE: Multidetector CT imaging of the abdomen and pelvis was performed using the standard protocol following bolus administration of intravenous contrast. CONTRAST:  148m OMNIPAQUE IOHEXOL 300 MG/ML  SOLN COMPARISON:  None. FINDINGS: Gallbladder is distended. There is at least mild gallbladder wall thickening and pericholecystic edema suggesting acute cholecystitis. 1 cm stone noted in the gallbladder neck region. Additional 1.6 cm stone noted in the gallbladder neck region. No adjacent bile duct dilatation. Scattered benign- appearing cysts noted within the bilateral liver lobes. Spleen, pancreas, adrenal glands, and kidneys appear normal. No renal or ureteral calculi identified. Bladder is unremarkable. Bowel is normal in caliber. No bowel wall  thickening or evidence of bowel wall inflammation. Appendix is  normal. No free fluid or abscess collections seen. No free intraperitoneal air. No enlarged lymph nodes seen. Abdominal aorta is normal in caliber. Mild atelectasis noted at each lung base. Degenerative changes are seen within the slightly scoliotic lumbar spine but no acute osseous abnormality. There are small bilateral inguinal hernias which contain fat only. IMPRESSION: 1. Gallbladder is moderately distended with at least mild gallbladder wall thickening and/or pericholecystic edema, highly suspicious for an early developing acute cholecystitis. Two gallstones are noted in the gallbladder neck region, largest measuring 1.6 cm diameter. 2. Scattered benign-appearing hepatic cysts. 3. Remainder of the abdomen and pelvis CT is unremarkable. No bowel obstruction. No free fluid or abscess. No renal or ureteral calculi. Appendix is normal. These results were called by telephone at the time of interpretation on 08/22/2015 at 6:50 pm to Dr. Lenise Arena , who verbally acknowledged these results. Electronically Signed   By: Franki Cabot M.D.   On: 08/22/2015 18:52    Ultrasound RUQ: Showing cholelithiasis without gallbladder wall thickening at 3.79mm and no CBD dilation at 3.77mm. Awaiting radiology read  Review of Systems  Constitutional: Positive for chills. Negative for fever, weight loss, malaise/fatigue and diaphoresis.  HENT: Negative for congestion and sore throat.   Respiratory: Negative for cough, sputum production and shortness of breath.   Cardiovascular: Negative for chest pain, palpitations and leg swelling.  Gastrointestinal: Positive for heartburn, nausea, vomiting and abdominal pain. Negative for diarrhea, constipation, blood in stool and melena.  Genitourinary: Negative for dysuria, urgency, frequency, hematuria and flank pain.  Musculoskeletal: Positive for back pain. Negative for myalgias and joint pain.  Skin: Negative for  itching and rash.  Neurological: Negative for dizziness, tremors, loss of consciousness, weakness and headaches.  Endo/Heme/Allergies: Negative for polydipsia. Does not bruise/bleed easily.  Psychiatric/Behavioral: The patient is not nervous/anxious.   All other systems reviewed and are negative.   Blood pressure 142/75, pulse 67, temperature 97.5 F (36.4 C), temperature source Oral, resp. rate 16, height $RemoveBe'5\' 10"'cUaNNrFry$  (1.778 m), weight 210 lb (95.255 kg), SpO2 98 %. Physical Exam  Vitals reviewed. Constitutional: He appears well-developed and well-nourished. No distress.  HENT:  Head: Normocephalic and atraumatic.  Right Ear: External ear normal.  Left Ear: External ear normal.  Nose: Nose normal.  Mouth/Throat: Oropharynx is clear and moist. No oropharyngeal exudate.  Eyes: Conjunctivae and EOM are normal. Pupils are equal, round, and reactive to light. No scleral icterus.  Neck: Normal range of motion. Neck supple. No tracheal deviation present.  Cardiovascular: Normal rate, regular rhythm, normal heart sounds and intact distal pulses.  Exam reveals no gallop and no friction rub.   No murmur heard. Respiratory: Effort normal and breath sounds normal. No respiratory distress. He has no wheezes. He has no rales. He exhibits no tenderness.  GI: Soft. He exhibits no distension and no mass. There is tenderness. There is no rebound and no guarding.  Hypoactive bowel sounds. Tender to light palpation in left lower quadrant and suprapubic area, mild tenderness in epigastrium as well, NO tenderness in RUQ, RLQ or LUQ, no CVA tenderness, NO murphy's sign, no masses, no umbilical or inguinal hernias     Assessment/Plan 59  Yr old with abdominal pain.  I personally reviewed his past medical history on file, as well as his past colonoscopy report showing 3 low grade TA polyps.  I reviewed his laboratory values with only WBC 13 being outside normal limits.  I reviewed his CT scan images personally showing  some gallbladder hydrops  and two large stones, no significant wall thickening or stranding; multiple cyst in liver, potentially some small dots of air in the lumen of stomach potentally signifying gastritis. I also reviewed the ultrasound images showing cholelithiasis but gallbladder wall at 3.34m, and no CBD dilation.  I also read the CT radiology read stated as above, no radiology read for U/S yet.  Given the patients symptoms of more lower abdominal pain, with only mild epigastric tenderness, and CT scan and U/S without strong evidence of acute cholecystitis, would recommend getting HIDA scan tomorrow.  My deferential would lean more toward gastritis or gastroenteritis than acute cholecystitis at this time.   CLavona MoundLoflin 08/22/2015, 8:08 PM  U/S report: Gallbladder is well distended. The wall is mildly thickened at 3.4 mm. Multiple stones and gallbladder sludge are noted. A 1.7 cm stone is noted lodged within the gallbladder neck similar to that seen on the recent CT examination. No significant pericholecystic fluid is Noted.  Will admit overnight, continue NPO, IV fluid, zosyn and will get HIDA in Am. I discussed the ultrasound findings and plan with patient and wife, he would like to avoid operation if at all possible.  He is still having pain in lower abdomen as opposed to upper. They were given the opportunity to ask questions and have them answered, are in agreement with plan.   CHubbard Robinson MD  08/22/2015 9:20 PM

## 2015-08-22 NOTE — ED Notes (Signed)
Pt returned from CT °

## 2015-08-22 NOTE — ED Provider Notes (Signed)
[facility] Emergency Department Provider Note     Time seen: ----------------------------------------- 5:50 PM on 08/22/2015 -----------------------------------------    I have reviewed the triage vital signs and the nursing notes.   HISTORY  Chief Complaint Abdominal Pain    HPI Marc Bolton is a 59 y.o. male who presents ER for abdominal pain that she describes as pressure and sharp. This has been going on since several nights ago when he also noted he had blood in his stool, Main complaint is lower abdominal pain that radiates into the upper abdomen currently. He denies any fevers or chills, denies any chest pain or difficulty breathing.  Past Medical History  Diagnosis Date  . HLD (hyperlipidemia)     no per pt    Patient Active Problem List   Diagnosis Date Noted  . Acute prostatitis 04/25/2014  . Increased frequency of urination 07/19/2013  . Shingles 11/01/2012  . Healthcare maintenance 12/12/2011  . HLD (hyperlipidemia) 03/26/2009    Past Surgical History  Procedure Laterality Date  . Septoplasty  1992  . Stress myoview  10/24/06    WNL except ST seg decrease inferiolaterally  . Colonoscopy  01/2012    polyps (tubular adenoma), cecal erosion, diverticulosis, rpt due 3-5 yrs    Allergies Review of patient's allergies indicates no known allergies.  Social History Social History  Substance Use Topics  . Smoking status: Never Smoker   . Smokeless tobacco: Never Used     Comment: Very rare cigar  . Alcohol Use: 2.4 oz/week    4 Glasses of wine per week     Comment: Wine- weekend    Review of Systems Constitutional: Negative for fever. Eyes: Negative for visual changes. ENT: Negative for sore throat. Cardiovascular: Negative for chest pain. Respiratory: Negative for shortness of breath. Gastrointestinal: Positive for abdominal pain, rectal bleeding Genitourinary: Negative for dysuria. Musculoskeletal: Negative for back  pain. Skin: Negative for rash. Neurological: Negative for headaches, focal weakness or numbness.  10-point ROS otherwise negative.  ____________________________________________   PHYSICAL EXAM:  VITAL SIGNS: ED Triage Vitals  Enc Vitals Group     BP 08/22/15 1643 142/75 mmHg     Pulse Rate 08/22/15 1643 67     Resp 08/22/15 1643 16     Temp 08/22/15 1643 97.5 F (36.4 C)     Temp Source 08/22/15 1643 Oral     SpO2 08/22/15 1643 98 %     Weight 08/22/15 1643 210 lb (95.255 kg)     Height 08/22/15 1643 5\' 10"  (1.778 m)     Head Cir --      Peak Flow --      Pain Score 08/22/15 1645 8     Pain Loc --      Pain Edu? --      Excl. in [place]? --     Constitutional: Alert and oriented. Well appearing and in no distress. Eyes: Conjunctivae are normal. PERRL. Normal extraocular movements. ENT   Head: Normocephalic and atraumatic.   Nose: No congestion/rhinnorhea.   Mouth/Throat: Mucous membranes are moist.   Neck: No stridor. Cardiovascular: Normal rate, regular rhythm. Normal and symmetric distal pulses are present in all extremities. No murmurs, rubs, or gallops. Respiratory: Normal respiratory effort without tachypnea nor retractions. Breath sounds are clear and equal bilaterally. No wheezes/rales/rhonchi. Gastrointestinal: Diffuse abdominal tenderness, no rebound or guarding. Normal bowel sounds. There is does not appear to be pain over McBurney's point, there is also epigastric tenderness and right upper quadrant  tenderness Musculoskeletal: Nontender with normal range of motion in all extremities. No joint effusions.  No lower extremity tenderness nor edema. Neurologic:  Normal speech and language. No gross focal neurologic deficits are appreciated. Speech is normal. No gait instability. Skin:  Skin is warm, dry and intact. No rash noted. Psychiatric: Mood and affect are normal. Speech and behavior are normal. Patient exhibits appropriate insight and  judgment. ____________________________________________  ED COURSE:  Pertinent labs & imaging results that were available during my care of the patient were reviewed by me and considered in my medical decision making (see chart for details). Patient with likely diverticulitis, we'll obtain CT imaging. ____________________________________________    LABS (pertinent positives/negatives)  Labs Reviewed  CBC WITH DIFFERENTIAL/PLATELET - Abnormal; Notable for the following:    WBC 13.8 (*)    Neutro Abs 11.1 (*)    Monocytes Absolute 1.1 (*)    All other components within normal limits  COMPREHENSIVE METABOLIC PANEL - Abnormal; Notable for the following:    Glucose, Bld 104 (*)    All other components within normal limits  LIPASE, BLOOD  URINALYSIS COMPLETEWITH MICROSCOPIC ([facility] ONLY)    RADIOLOGY Images were viewed by me  CT of the abdomen and pelvis IMPRESSION: 1. Gallbladder is moderately distended with at least mild gallbladder wall thickening and/or pericholecystic edema, highly suspicious for an early developing acute cholecystitis. Two gallstones are noted in the gallbladder neck region, largest measuring 1.6 cm diameter. 2. Scattered benign-appearing hepatic cysts. 3. Remainder of the abdomen and pelvis CT is unremarkable. No bowel obstruction. No free fluid or abscess. No renal or ureteral calculi. Appendix is normal. These results were called by telephone at the time of interpretation on 08/22/2015 at 6:50 pm to Dr. Lenise Arena , who verbally acknowledged these results. ____________________________________________  FINAL ASSESSMENT AND PLAN  Abdominal pain, cholecystitis  Plan: Patient with labs and imaging as dictated above. Patient surprisingly with cholecystitis. Will discuss with general surgery for admission, patient will receive IV Zosyn. He is received 2 doses of IV pain medication at this time.   Earleen Newport, MD   Earleen Newport,  MD 08/22/15 [phone]

## 2015-08-23 ENCOUNTER — Observation Stay: Payer: 59

## 2015-08-23 DIAGNOSIS — K8020 Calculus of gallbladder without cholecystitis without obstruction: Secondary | ICD-10-CM | POA: Diagnosis not present

## 2015-08-23 DIAGNOSIS — K8001 Calculus of gallbladder with acute cholecystitis with obstruction: Secondary | ICD-10-CM | POA: Diagnosis not present

## 2015-08-23 DIAGNOSIS — R100 Acute abdomen: Secondary | ICD-10-CM | POA: Diagnosis not present

## 2015-08-23 LAB — COMPREHENSIVE METABOLIC PANEL
ALBUMIN: 3.8 g/dL (ref 3.5–5.0)
ALT: 23 U/L (ref 17–63)
ANION GAP: 5 (ref 5–15)
AST: 23 U/L (ref 15–41)
Alkaline Phosphatase: 39 U/L (ref 38–126)
BILIRUBIN TOTAL: 1.4 mg/dL — AB (ref 0.3–1.2)
BUN: 10 mg/dL (ref 6–20)
CO2: 27 mmol/L (ref 22–32)
Calcium: 9 mg/dL (ref 8.9–10.3)
Chloride: 106 mmol/L (ref 101–111)
Creatinine, Ser: 1.03 mg/dL (ref 0.61–1.24)
GFR calc Af Amer: >60 mL/min (ref >60)
GFR calc non Af Amer: >60 mL/min (ref >60)
GLUCOSE: 118 mg/dL — AB (ref 65–99)
POTASSIUM: 3.7 mmol/L (ref 3.5–5.1)
SODIUM: 138 mmol/L (ref 135–145)
TOTAL PROTEIN: 6.6 g/dL (ref 6.5–8.1)

## 2015-08-23 LAB — CBC
HEMATOCRIT: 42.6 % (ref 40.0–52.0)
HEMOGLOBIN: 14.4 g/dL (ref 13.0–18.0)
MCH: 31.4 pg (ref 26.0–34.0)
MCHC: 33.8 g/dL (ref 32.0–36.0)
MCV: 93 fL (ref 80.0–100.0)
PLATELETS: 203 10*3/uL (ref 150–440)
RBC: 4.58 MIL/uL (ref 4.40–5.90)
RDW: 13.3 % (ref 11.5–14.5)
WBC: 13.1 10*3/uL — ABNORMAL HIGH (ref 3.8–10.6)

## 2015-08-23 LAB — MRSA PCR SCREENING: MRSA BY PCR: NEGATIVE

## 2015-08-23 MED ORDER — TECHNETIUM TC 99M MEBROFENIN IV KIT
5.0000 | PACK | Freq: Once | INTRAVENOUS | Status: DC | PRN
  Administered 2015-08-23: 5 via INTRAVENOUS
  Filled 2015-08-23: qty 6

## 2015-08-23 MED ORDER — MORPHINE SULFATE (PF) 4 MG/ML IV SOLN
3.8000 mg | Freq: Once | INTRAVENOUS | Status: AC
  Administered 2015-08-23: 3.8 mg via INTRAVENOUS
  Filled 2015-08-23: qty 1

## 2015-08-23 NOTE — Progress Notes (Signed)
Surgery Progress Note  S: H and P reviewed with patient.  Min pain, none in upper abdomen.   O:Blood pressure 135/75, pulse 64, temperature 99 F (37.2 C), temperature source Oral, resp. rate 18, height 5\' 10"  (1.778 m), weight 209 lb 1.6 oz (94.847 kg), SpO2 96 %. GEN: NAD/A&Ox3 ABD: soft, min tender suprapubic, nondistended  WBC 13.1  A/P 59 yo admit with periumbilical and suprapubic pain with leukocytosis, RUQ [place] which shows nonmobile gallstone but story and exam not consistent with cholecystitis vs hydrops.  Have discussed situation with Mr. Bancroft and I recommend HIDA scan to eval for possible obstructing gallstone.  He agrees with this plan of action.

## 2015-08-23 NOTE — Progress Notes (Signed)
59yr old male with mild pain in lower abdomen.  Patient states feeling much better today.  He denies any nausea, vomiting, or epigastric pain at this time.    Filed Vitals:   08/23/15 2009  BP: 111/69  Pulse: 73  Temp: 98.5 F (36.9 C)  Resp: 18   PE: Gen: NAD, AAOx3 Abd: soft, mild tenderness in LLQ  CBC Latest Ref Rng 08/23/2015 08/22/2015 11/17/2014  WBC 3.8 - 10.6 K/uL 13.1(H) 13.8(H) 5.7  Hemoglobin 13.0 - 18.0 g/dL 14.4 16.0 15.1  Hematocrit 40.0 - 52.0 % 42.6 47.8 44.6  Platelets 150 - 440 K/uL 203 260 249.0   CMP Latest Ref Rng 08/23/2015 08/22/2015 12/04/2014  Glucose 65 - 99 mg/dL 118(H) 104(H) 102(H)  BUN 6 - 20 mg/dL 10 11 14   Creatinine 0.61 - 1.24 mg/dL 1.03 0.95 1.09  Sodium 135 - 145 mmol/L 138 139 145(H)  Potassium 3.5 - 5.1 mmol/L 3.7 3.7 4.3  Chloride 101 - 111 mmol/L 106 102 105  CO2 22 - 32 mmol/L 27 26 25   Calcium 8.9 - 10.3 mg/dL 9.0 9.3 9.5  Total Protein 6.5 - 8.1 g/dL 6.6 7.5 6.8  Total Bilirubin 0.3 - 1.2 mg/dL 1.4(H) 1.0 0.9  Alkaline Phos 38 - 126 U/L 39 54 59  AST 15 - 41 U/L 23 25 21   ALT 17 - 63 U/L 23 25 21    NM HIDA scan: Additional images again demonstrate no evidence of gallbladder filling compatible with an obstructed cystic duct and acute cholecystitis.  A/P:  59 yr old male with lower abdominal pain and finding concerning for acute cholecystitis.  I discussed the results of his labs and his imaging studies which I personally reviewed.  Even though he doesn't have the typical symptoms his bilirubin of 1.4 and positive HIDA would suggest need for Lap chole.  The risks, benefits, complications, treatment options, and expected outcomes were discussed with the patient. The possibilities of bleeding, recurrent infection, finding a normal gallbladder, perforation of viscus organs, damage to surrounding structures, bile leak, abscess formation, needing a drain placed, the need for additional procedures, reaction to medication, pulmonary aspiration,   failure to diagnose a condition, the possible need to convert to an open procedure, and creating a complication requiring transfusion or operation were discussed with the patient. The patient and/or family concurred with the proposed plan, giving informed consent. He will be scheduled for Lap chole tomorrow with my associate Dr. Marina Gravel.

## 2015-08-24 ENCOUNTER — Encounter
Admission: EM | Disposition: A | Discharge status: Home / Self Care | Payer: Self-pay | Source: Home / Self Care | Attending: Emergency Medicine

## 2015-08-24 ENCOUNTER — Encounter: Payer: Self-pay | Admitting: Surgery

## 2015-08-24 ENCOUNTER — Observation Stay: Payer: 59 | Admitting: Certified Registered Nurse Anesthetist

## 2015-08-24 DIAGNOSIS — K8000 Calculus of gallbladder with acute cholecystitis without obstruction: Secondary | ICD-10-CM

## 2015-08-24 HISTORY — PX: CHOLECYSTECTOMY: SHX55

## 2015-08-24 LAB — COMPREHENSIVE METABOLIC PANEL
ALK PHOS: 40 U/L (ref 38–126)
ALT: 21 U/L (ref 17–63)
ANION GAP: 6 (ref 5–15)
AST: 17 U/L (ref 15–41)
Albumin: 3.3 g/dL — ABNORMAL LOW (ref 3.5–5.0)
BUN: 12 mg/dL (ref 6–20)
CALCIUM: 8.5 mg/dL — AB (ref 8.9–10.3)
CO2: 29 mmol/L (ref 22–32)
Chloride: 106 mmol/L (ref 101–111)
Creatinine, Ser: 1 mg/dL (ref 0.61–1.24)
GFR calc non Af Amer: >60 mL/min (ref >60)
Glucose, Bld: 93 mg/dL (ref 65–99)
POTASSIUM: 3.6 mmol/L (ref 3.5–5.1)
SODIUM: 141 mmol/L (ref 135–145)
Total Bilirubin: 1.7 mg/dL — ABNORMAL HIGH (ref 0.3–1.2)
Total Protein: 5.9 g/dL — ABNORMAL LOW (ref 6.5–8.1)

## 2015-08-24 LAB — CBC
HCT: 41 % (ref 40.0–52.0)
HEMOGLOBIN: 14 g/dL (ref 13.0–18.0)
MCH: 32.2 pg (ref 26.0–34.0)
MCHC: 34.2 g/dL (ref 32.0–36.0)
MCV: 94.1 fL (ref 80.0–100.0)
PLATELETS: 179 10*3/uL (ref 150–440)
RBC: 4.36 MIL/uL — AB (ref 4.40–5.90)
RDW: 13.7 % (ref 11.5–14.5)
WBC: 9.9 10*3/uL (ref 3.8–10.6)

## 2015-08-24 SURGERY — LAPAROSCOPIC CHOLECYSTECTOMY WITH INTRAOPERATIVE CHOLANGIOGRAM
Anesthesia: General | Wound class: Clean Contaminated

## 2015-08-24 MED ORDER — GLYCOPYRROLATE 0.2 MG/ML IJ SOLN
INTRAMUSCULAR | Status: DC | PRN
  Administered 2015-08-24: 0.2 mg via INTRAVENOUS
  Administered 2015-08-24: 0.4 mg via INTRAVENOUS

## 2015-08-24 MED ORDER — DEXAMETHASONE SODIUM PHOSPHATE 4 MG/ML IJ SOLN
INTRAMUSCULAR | Status: DC | PRN
  Administered 2015-08-24: 4 mg via INTRAVENOUS

## 2015-08-24 MED ORDER — SUCCINYLCHOLINE CHLORIDE 20 MG/ML IJ SOLN
INTRAMUSCULAR | Status: DC | PRN
  Administered 2015-08-24: 140 mg via INTRAVENOUS

## 2015-08-24 MED ORDER — LIDOCAINE HCL (CARDIAC) 20 MG/ML IV SOLN
INTRAVENOUS | Status: DC | PRN
  Administered 2015-08-24: 100 mg via INTRAVENOUS

## 2015-08-24 MED ORDER — PROPOFOL 10 MG/ML IV BOLUS
INTRAVENOUS | Status: DC | PRN
  Administered 2015-08-24: 200 mg via INTRAVENOUS
  Administered 2015-08-24: 30 mg via INTRAVENOUS

## 2015-08-24 MED ORDER — FENTANYL CITRATE (PF) 100 MCG/2ML IJ SOLN
25.0000 ug | INTRAMUSCULAR | Status: DC | PRN
  Administered 2015-08-24 (×3): 25 ug via INTRAVENOUS
  Filled 2015-08-24 (×3): qty 0.5

## 2015-08-24 MED ORDER — NEOSTIGMINE METHYLSULFATE 10 MG/10ML IV SOLN
INTRAVENOUS | Status: DC | PRN
  Administered 2015-08-24: 3 mg via INTRAVENOUS

## 2015-08-24 MED ORDER — OXYCODONE-ACETAMINOPHEN 5-325 MG PO TABS
1.0000 | ORAL_TABLET | Freq: Four times a day (QID) | ORAL | Status: DC | PRN
  Administered 2015-08-24: 1 via ORAL
  Administered 2015-08-25 (×2): 2 via ORAL
  Filled 2015-08-24: qty 2
  Filled 2015-08-24: qty 1
  Filled 2015-08-24: qty 2

## 2015-08-24 MED ORDER — MIDAZOLAM HCL 2 MG/2ML IJ SOLN
INTRAMUSCULAR | Status: DC | PRN
  Administered 2015-08-24: 2 mg via INTRAVENOUS

## 2015-08-24 MED ORDER — ACETAMINOPHEN 10 MG/ML IV SOLN
INTRAVENOUS | Status: DC | PRN
  Administered 2015-08-24: 1000 mg via INTRAVENOUS

## 2015-08-24 MED ORDER — ROCURONIUM BROMIDE 100 MG/10ML IV SOLN
INTRAVENOUS | Status: DC | PRN
  Administered 2015-08-24: 10 mg via INTRAVENOUS
  Administered 2015-08-24: 5 mg via INTRAVENOUS
  Administered 2015-08-24: 15 mg via INTRAVENOUS
  Administered 2015-08-24: 10 mg via INTRAVENOUS

## 2015-08-24 MED ORDER — GELATIN ABSORBABLE MT POWD
OROMUCOSAL | Status: DC | PRN
  Administered 2015-08-24: 5 mL via TOPICAL

## 2015-08-24 MED ORDER — ONDANSETRON HCL 4 MG/2ML IJ SOLN
INTRAMUSCULAR | Status: DC | PRN
  Administered 2015-08-24: 4 mg via INTRAVENOUS

## 2015-08-24 MED ORDER — ONDANSETRON HCL 4 MG/2ML IJ SOLN: 4.0000 mg | Freq: Once | INTRAMUSCULAR | Status: DC | PRN

## 2015-08-24 MED ORDER — FENTANYL CITRATE (PF) 100 MCG/2ML IJ SOLN
INTRAMUSCULAR | Status: DC | PRN
  Administered 2015-08-24 (×2): 50 ug via INTRAVENOUS
  Administered 2015-08-24: 100 ug via INTRAVENOUS

## 2015-08-24 MED ORDER — BUPIVACAINE HCL 0.25 % IJ SOLN
INTRAMUSCULAR | Status: DC | PRN
  Administered 2015-08-24: 30 mL

## 2015-08-24 SURGICAL SUPPLY — 55 items
APPLICATOR SURGIFLO ENDO (HEMOSTASIS) ×2 IMPLANT
APPLIER CLIP 5 13 M/L LIGAMAX5 (MISCELLANEOUS) ×2
BAG COUNTER SPONGE EZ (MISCELLANEOUS) IMPLANT
BLADE CLIPPER SURG (BLADE) ×2 IMPLANT
BULB RESERV EVAC DRAIN JP 100C (MISCELLANEOUS) ×2 IMPLANT
CANISTER SUCT 1200ML W/VALVE (MISCELLANEOUS) ×2 IMPLANT
CANISTER SUCT 3000ML (MISCELLANEOUS) ×2 IMPLANT
CATH CHOLANG 76X19 KUMAR (CATHETERS) IMPLANT
CHLORAPREP W/TINT 26ML (MISCELLANEOUS) ×4 IMPLANT
CLIP APPLIE 5 13 M/L LIGAMAX5 (MISCELLANEOUS) ×1 IMPLANT
CLIP LIGATING HEM O LOK PURPLE (MISCELLANEOUS) ×2 IMPLANT
DEFOGGER SCOPE WARMER CLEARIFY (MISCELLANEOUS) ×2 IMPLANT
DISSECTOR KITTNER STICK (MISCELLANEOUS) ×1 IMPLANT
DISSECTORS/KITTNER STICK (MISCELLANEOUS) ×2
DRAIN CHANNEL JP 19F (MISCELLANEOUS) ×2 IMPLANT
DRAPE C-ARM XRAY 36X54 (DRAPES) IMPLANT
DRAPE LAPAROTOMY 100X77 ABD (DRAPES) ×2 IMPLANT
DRAPE SHEET LG 3/4 BI-LAMINATE (DRAPES) IMPLANT
DRAPE UTILITY 15X26 TOWEL STRL (DRAPES) ×4 IMPLANT
DRSG TEGADERM 2-3/8X2-3/4 SM (GAUZE/BANDAGES/DRESSINGS) ×8 IMPLANT
DRSG TELFA 3X8 NADH (GAUZE/BANDAGES/DRESSINGS) ×2 IMPLANT
ENDOPOUCH RETRIEVER 10 (MISCELLANEOUS) ×2 IMPLANT
GAUZE SPONGE 4X4 12PLY STRL (GAUZE/BANDAGES/DRESSINGS) ×2 IMPLANT
GLOVE BIO SURGEON STRL SZ7.5 (GLOVE) ×10 IMPLANT
GOWN STRL REUS W/ TWL LRG LVL3 (GOWN DISPOSABLE) ×3 IMPLANT
GOWN STRL REUS W/ TWL XL LVL3 (GOWN DISPOSABLE) ×1 IMPLANT
GOWN STRL REUS W/TWL LRG LVL3 (GOWN DISPOSABLE) ×3
GOWN STRL REUS W/TWL XL LVL3 (GOWN DISPOSABLE) ×1
IRRIGATION STRYKERFLOW (MISCELLANEOUS) ×1 IMPLANT
IRRIGATOR STRYKERFLOW (MISCELLANEOUS) ×2
IV NS 1000ML (IV SOLUTION) ×2
IV NS 1000ML BAXH (IV SOLUTION) ×2 IMPLANT
LABEL OR SOLS (LABEL) ×2 IMPLANT
NEEDLE FILTER BLUNT 18X 1/2SAF (NEEDLE) ×1
NEEDLE FILTER BLUNT 18X1 1/2 (NEEDLE) ×1 IMPLANT
NEEDLE HYPO 25X1 1.5 SAFETY (NEEDLE) ×2 IMPLANT
NS IRRIG 500ML POUR BTL (IV SOLUTION) ×2 IMPLANT
PACK LAP CHOLECYSTECTOMY (MISCELLANEOUS) ×2 IMPLANT
PAD GROUND ADULT SPLIT (MISCELLANEOUS) ×2 IMPLANT
SCISSORS METZENBAUM CVD 33 (INSTRUMENTS) ×2 IMPLANT
SLEEVE ADV FIXATION 5X100MM (TROCAR) ×4 IMPLANT
SPOGE SURGIFLO 8M (HEMOSTASIS) ×1
SPONGE SURGIFLO 8M (HEMOSTASIS) ×1 IMPLANT
STRAP SAFETY BODY (MISCELLANEOUS) ×2 IMPLANT
STRIP CLOSURE SKIN 1/2X4 (GAUZE/BANDAGES/DRESSINGS) ×2 IMPLANT
SUT ETHILON 3-0 FS-10 30 BLK (SUTURE) ×2
SUT VIC AB 0 UR5 27 (SUTURE) ×4 IMPLANT
SUT VIC AB 4-0 FS2 27 (SUTURE) ×2 IMPLANT
SUTURE EHLN 3-0 FS-10 30 BLK (SUTURE) ×1 IMPLANT
SWABSTK COMLB BENZOIN TINCTURE (MISCELLANEOUS) ×2 IMPLANT
SYR 5ML LL (SYRINGE) ×2 IMPLANT
TROCAR XCEL 12X100 BLDLESS (ENDOMECHANICALS) ×2 IMPLANT
TROCAR XCEL BLUNT TIP 100MML (ENDOMECHANICALS) ×2 IMPLANT
TROCAR Z-THREAD OPTICAL 5X100M (TROCAR) ×2 IMPLANT
TUBING INSUFFLATOR HI FLOW (MISCELLANEOUS) ×2 IMPLANT

## 2015-08-24 NOTE — Op Note (Signed)
08/22/2015 - 08/24/2015  12:41 PM  PATIENT:  Marc Bolton  59 y.o. male  PRE-OPERATIVE DIAGNOSIS:  Acute cholecystitis  POST-OPERATIVE DIAGNOSIS:  Acute cholecystitis  PROCEDURE:  Procedure(s): LAPAROSCOPIC CHOLECYSTECTOMY (N/A)  SURGEON:  Surgeon(s) and Role:    Sherri Rad, MD - Primary  ASSISTANTS: Tech  ANESTHESIA: Gen.   SPECIMEN: Gallbladder and contents    EBL: 100 cc  Description of procedure:   With the patient in the supine position general endotracheal anesthesia was induced. The patient's abdomen was clipped of hair sterilely prepped and draped core prep solution and left arm was padded and tucked at his side. Time out was observed.  A 12 mm Hassan trocar was placed through an open technique through an infraumbilical skin incision. Pneumoperitoneum was established. The patient was then positioned in reverse Trendelenburg and airplaned right side up. A 5 mm bladeless trocar was placed in the epigastric region.  Two 5 mm ports along the right subcostal margin.  The gallbladder was tensely distended with fluid, hyperemic and consistent with acute cholecystitis. Adhesions of the omentum were taken down with blunt technique. 120 cc of thick motor oil bile was aspirated allowing placement of the grasper.  Attention was then placed in the gallbladder with lateral traction applied to Hartman's pouch. The hepatoduodenal ligament was then dissected of inflammatory rind with blunt technique and no energy.  A large obstructing stone was manipulated back into the body of the gallbladder without difficulty.  An anteriorly based artery was identified with a small short posterior branch. These were divided between hemoclips. The cystic duct was identified with a critical view of safety cholecystectomy been achieved and the cystic duct was very short.  As such the 5 mm epigastric trocar was exchanged for a 12 mm trocar and utilizing the laparoscopic 12 mm Rodena Medin applicator the cystic duct  was doubly ligated with medium locking hemoclips. No clip was placed on the gallbladder side. The gallbladder was then incised off the cystic duct stump. Small amount of bile and one stone was dropped which was later covered.   Further dissection on the gallbladder fossa demonstrated no aberrant bile duct or artery. The gallbladder was retrieved off the gallbladder fossa utilizing hook cautery apparatus. The specimen was placed into an Endo Catch device and retrieved after incising the infraumbilical fascial defect 5 mm.  Pneumoperitoneum was then reestablished. The right upper quadrant was irrigated with 2 L of warm normal saline during the operation all bile and blood was aspirated point hemostasis was obtained the gallbladder fossa with hook cautery and application of Surgi-Flo with thrombin application. A 19 mm Blake drain was directed into Morison's pouch and exited the lower most right upper quadrant port site.  The infraumbilical fascial defect was closed with 2 figure-of-eight #0 Vicryl sutures in vertical orientation the existing stay sutures tied to each other. A site was secured with nylon suture. A total of 30 cc of 0.25% plain Marcaine was infiltrated along all skin and fascial incisions prior to closure. 4-0 Vicryl subcuticular was applied to skin edges followed indication of benzoin Steri-Strips Telfa and Tegaderm. The patient was then subsequently extubated to recovery room in stable and satisfactory condition by anesthesia services.   [name], MD, FACS

## 2015-08-24 NOTE — Transfer of Care (Signed)
Immediate Anesthesia Transfer of Care Note  Patient: Marc Bolton  Procedure(s) Performed: Procedure(s): LAPAROSCOPIC CHOLECYSTECTOMY (N/A)  Patient Location: PACU  Anesthesia Type:General  Level of Consciousness: awake and alert   Airway & Oxygen Therapy: Patient Spontanous Breathing and Patient connected to nasal cannula oxygen  Post-op Assessment: Report given to RN and Post -op Vital signs reviewed and stable  Post vital signs: Reviewed and stable  Last Vitals:  Filed Vitals:   08/24/15 1241  BP: 130/70  Pulse: 65  Temp: 36.9 C  Resp: 14    Complications: No apparent anesthesia complications

## 2015-08-24 NOTE — Progress Notes (Signed)
Surgery.  Assuming care from Drs. Laughlin and Lundquist. Case reviewed x-rays and serum laboratory values reviewed in person. Discussed with him and his significant other present laparoscopic cholecystectomy the possibility of open procedure the risk of bile duct injury bleeding infection need for conversion open operation as well as post-op ERCP if necessary.  All questions were answered to their satisfaction.  He wishes to proceed.

## 2015-08-24 NOTE — Anesthesia Preprocedure Evaluation (Signed)
Anesthesia Evaluation  Patient identified by MRN, date of birth, ID band Patient awake    Reviewed: Allergy & Precautions, NPO status   Airway Mallampati: II       Dental no notable dental hx.    Pulmonary neg pulmonary ROS,    Pulmonary exam normal        Cardiovascular negative cardio ROS   Rhythm:Regular Rate:Normal     Neuro/Psych    GI/Hepatic negative GI ROS, Neg liver ROS,   Endo/Other  negative endocrine ROS  Renal/GU negative Renal ROS     Musculoskeletal negative musculoskeletal ROS (+)   Abdominal Normal abdominal exam  (+)   Peds negative pediatric ROS (+)  Hematology negative hematology ROS (+)   Anesthesia Other Findings   Reproductive/Obstetrics negative OB ROS                             Anesthesia Physical Anesthesia Plan  ASA: I  Anesthesia Plan: General   Post-op Pain Management:    Induction: Intravenous  Airway Management Planned: Oral ETT  Additional Equipment:   Intra-op Plan:   Post-operative Plan: Extubation in OR  Informed Consent: I have reviewed the patients History and Physical, chart, labs and discussed the procedure including the risks, benefits and alternatives for the proposed anesthesia with the patient or authorized representative who has indicated his/her understanding and acceptance.     Plan Discussed with: CRNA  Anesthesia Plan Comments:         Anesthesia Quick Evaluation

## 2015-08-24 NOTE — Anesthesia Procedure Notes (Signed)
Procedure Name: Intubation Date/Time: 08/24/2015 10:50 AM Performed by: Johnna Acosta Pre-anesthesia Checklist: Patient identified, Emergency Drugs available, Suction available and Patient being monitored Patient Re-evaluated:Patient Re-evaluated prior to inductionOxygen Delivery Method: Circle system utilized Preoxygenation: Pre-oxygenation with 100% oxygen Intubation Type: IV induction Ventilation: Mask ventilation without difficulty and Oral airway inserted - appropriate to patient size Laryngoscope Size: Sabra Heck and 2 Grade View: Grade I Tube type: Oral Tube size: 7.5 mm Number of attempts: 1 Placement Confirmation: ETT inserted through vocal cords under direct vision and positive ETCO2 Secured at: 21 cm Tube secured with: Tape Dental Injury: Teeth and Oropharynx as per pre-operative assessment

## 2015-08-24 NOTE — Anesthesia Postprocedure Evaluation (Signed)
  Anesthesia Post-op Note  Patient: Marc Bolton  Procedure(s) Performed: Procedure(s): LAPAROSCOPIC CHOLECYSTECTOMY (N/A)  Anesthesia type:General  Patient location: PACU  Post pain: Pain level controlled  Post assessment: Post-op Vital signs reviewed, Patient's Cardiovascular Status Stable, Respiratory Function Stable, Patent Airway and No signs of Nausea or vomiting  Post vital signs: Reviewed and stable  Last Vitals:  Filed Vitals:   08/24/15 1307  BP:   Pulse: 58  Temp:   Resp: 12    Level of consciousness: awake, alert  and patient cooperative  Complications: No apparent anesthesia complications

## 2015-08-24 NOTE — Progress Notes (Signed)
   Surgery postop  The patient is doing well. Pain seems well controlled. No nausea no vomiting.  Filed Vitals:   08/24/15 1321 08/24/15 1349 08/24/15 1430 08/24/15 1438  BP:  128/70 126/68 132/73  Pulse: 57 61 61 66  Temp:  97.5 F (36.4 C) 98.2 F (36.8 C) 98.5 F (36.9 C)  TempSrc:  Oral Oral Oral  Resp: 13 14 12 15   Height:      Weight:      SpO2: 91% 94% 96% 96%    Jackson-Pratt drain is nonbilious.  Visible examination the patient's affect and mood is normal. He is alert and oriented. Abdomen is soft and nontender. JP is nonbilious.  Impression doing well status post laparoscopic cholecystectomy for acute calculus cholecystitis.  Plan advance diet in morning and suspect he can be discharged tomorrow sometime. Both him and his significant other in agreement. All of his questions were answered.

## 2015-08-25 ENCOUNTER — Encounter: Payer: Self-pay | Admitting: Family Medicine

## 2015-08-25 LAB — SURGICAL PATHOLOGY

## 2015-08-25 MED ORDER — OXYCODONE-ACETAMINOPHEN 5-325 MG PO TABS: 1.0000 | ORAL_TABLET | Freq: Four times a day (QID) | ORAL | Status: DC | PRN

## 2015-08-25 NOTE — Discharge Instructions (Signed)
Cholecystitis Cholecystitis is inflammation of the gallbladder. It is often called a gallbladder attack. The gallbladder is a pear-shaped organ that lies beneath the liver on the right side of the body. The gallbladder stores bile, which is a fluid that helps the body to digest fats. If bile builds up in your gallbladder, your gallbladder becomes inflamed. This condition may occur suddenly (be acute). Repeat episodes of acute cholecystitis or prolonged episodes may lead to a long-term (chronic) condition. Cholecystitis is serious and it requires treatment.  CAUSES The most common cause of this condition is gallstones. Gallstones can block the tube (duct) that carries bile out of your gallbladder. This causes bile to build up. Other causes of this condition include:  Damage to the gallbladder due to a decrease in blood flow.  Infections in the bile ducts.  Scars or kinks in the bile ducts.  Tumors in the liver, pancreas, or gallbladder. RISK FACTORS This condition is more likely to develop in:  People who have sickle cell disease.  People who take birth control pills or use estrogen.  People who have alcoholic liver disease.  People who have liver cirrhosis.  People who have their nutrition delivered through a vein (parenteral nutrition).  People who do not eat or drink (do fasting) for a long period of time.  People who are obese.  People who have rapid weight loss.  People who are pregnant.  People who have increased triglyceride levels.  People who have pancreatitis. SYMPTOMS Symptoms of this condition include:  Abdominal pain, especially in the upper right area of the abdomen.  Abdominal tenderness or bloating.  Nausea.  Vomiting.  Fever.  Chills.  Yellowing of the skin and the whites of the eyes (jaundice). DIAGNOSIS This condition is diagnosed with a medical history and physical exam. You may also have other tests, including:  Imaging tests, such as:  An  ultrasound of the gallbladder.  A CT scan of the abdomen.  A gallbladder nuclear scan (HIDA scan). This scan allows your health care provider to see the bile moving from your liver to your gallbladder and to your small intestine.  MRI.  Blood tests, such as:  A complete blood count, because the white blood cell count may be higher than normal.  Liver function tests, because some levels may be higher than normal with certain types of gallstones. TREATMENT Treatment may include:  Fasting for a certain amount of time.  IV fluids.  Medicine to treat pain or vomiting.  Antibiotic medicine.  Surgery to remove your gallbladder (cholecystectomy). This may happen immediately or at a later time. HOME CARE INSTRUCTIONS Home care will depend on your treatment. In general:  Take over-the-counter and prescription medicines only as told by your health care provider.  If you were prescribed an antibiotic medicine, take it as told by your health care provider. Do not stop taking the antibiotic even if you start to feel better.  Follow instructions from your health care provider about what to eat or drink. When you are allowed to eat, avoid eating or drinking anything that triggers your symptoms.  Keep all follow-up visits as told by your health care provider. This is important. SEEK MEDICAL CARE IF:  Your pain is not controlled with medicine.  You have a fever. SEEK IMMEDIATE MEDICAL CARE IF:  Your pain moves to another part of your abdomen or to your back.  You continue to have symptoms or you develop new symptoms even with treatment.   This information   is not intended to replace advice given to you by your health care provider. Make sure you discuss any questions you have with your health care provider.   Document Released: 09/26/2005 Document Revised: 06/17/2015 Document Reviewed: 01/07/2015 Elsevier Interactive Patient Education 2016 [employer]  

## 2015-08-25 NOTE — Discharge Summary (Signed)
Physician Discharge Summary  Patient ID: Marc Bolton MRN: [id number] DOB/AGE: 1956/04/12 59 y.o.  Admit date: 08/22/2015 Discharge date: 08/25/2015  Admission Diagnoses: Acute cholecystitis  Discharge Diagnoses:  Active Problems:   Cholelithiasis   Calculus of gallbladder with acute cholecystitis and obstruction   Discharged Condition: Stable and improved  Hospital Course: The patient was admitted with abdominal pain and diagnosis of acute cholecystitis was made. He was taken to the operating room for laparoscopic cholecystectomy on November 15. He tolerated this well. Jackson-Pratt drain was left in place. The following day the patient was doing well. No bile was noted in the drain. He was discharged home in stable condition with follow-up this Friday for drain removal and follow-up.  Consults: None  Significant Diagnostic Studies: Ultrasound, CT scan, HIDA scan.  Treatments: See above  Discharge Exam: Blood pressure 99/59, pulse 66, temperature 98.4 F (36.9 C), temperature source Oral, resp. rate 18, height 5\' 10"  (1.778 m), weight 209 lb 1.6 oz (94.847 kg), SpO2 95 %. Dressings are dry abdomen was soft and no bile present in drain.  Disposition: Home with self-care    Discharge Instructions    Diet general    Complete by:  As directed      Discharge instructions    Complete by:  As directed   DISCHARGE INSTRUCTIONS TO PATIENT  REMINDER:  Carry a list of your medications and allergies with you at all times Call your pharmacy at least 1 week in advance to refill prescriptions Do not mix any prescribed pain medicine with alcohol Do not drive any motor vehicles while taking pain medication. Take medications with food.  Do not retake a pain medication if you vomit after taking it.  Activity: no lifting more than 15 pounds until instructed by your doctor.   Dressing Care Instruction (if applicable):              Remove operative dressings in 48 hours.  May Shower-   Call office if any questions regarding this activity.  Dry Dressing as needed to operative site.  Drain care instructions provided to you in the hospital.   Follow-up appointments (date to return to physician): Call for appointment with Dr. Sherri Rad, MD at [phone] or [phone]  If need MD on call after hours and on weekends call Hospital operator at [phone] as ask to speak to Surgeon on call for [facility].  Call Surgeon if you have: Temperature greater than 100.4 Persistent nausea and vomiting Severe uncontrolled pain Redness, tenderness, or signs of infection (pain, swelling, redness, odor or green/yellow discharge around the site) Difficulty breathing, headache or visual disturbances Hives Persistent dizziness or light-headedness Extreme fatigue Any other questions or concerns you may have after discharge  In an emergency, call 911 or go to an Emergency Department at a nearby hospital  Diet:  Resume your usual diet.  Avoid spicy, greasy or heavy foods.  If you have nausea or vomiting, go back to liquids.  If you cannot keep liquids down, call your doctor.  Avoid alcohol consumption while on prescription pain medications. Good nutrition promotes healing. Increase fiber and fluids.     I understand and acknowledge receipt of the above instructions.  Patient or Guardian Signature                                                                    Date/Time                                                                                                                                        Physician's or R.N.'s Signature                                                                  Date/Time  The discharge instructions have been reviewed with the patient and/or Family Member/Parent/Guardian.  Patient and/or Family  Member/Parent/Guardian signed and retained a printed copy.     Discharge wound care:    Complete by:  As directed   JP care and recording     Increase activity slowly    Complete by:  As directed      Leave dressing on - Keep it clean, dry, and intact until clinic visit    Complete by:  As directed             Medication List    TAKE these medications        aspirin 81 MG tablet  Take 81 mg by mouth daily.     Calcium Carb-Cholecalciferol 1000-800 MG-UNIT Tabs  Take 1 tablet by mouth daily.     multivitamin tablet  Take 1 tablet by mouth daily.     oxyCODONE-acetaminophen 5-325 MG tablet  Commonly known as:  PERCOCET/ROXICET  Take 1-2 tablets by mouth every 6 (six) hours as needed for moderate pain.     tamsulosin 0.4 MG Caps capsule  Commonly known as:  FLOMAX  Take 1 capsule by mouth  daily           Follow-up Information    Follow up with Sherri Rad, MD On 08/28/2015.   Specialties:  Surgery, Radiology   Why:  With Dr Marina Gravel in [place] office location, For suture removal, For wound re-check   Contact information:   [address] [place] [address] [phone]       Signed: Sherri Rad 08/25/2015, 1:34 PM

## 2015-08-27 ENCOUNTER — Telehealth: Payer: Self-pay

## 2015-08-27 NOTE — Telephone Encounter (Signed)
Post discharge call to patient made at this time. Pain is controlled at this time with use of medication given. Bowels moving normally per patient. Denies nausea, vomiting, and fever. Drain is tapering down nicely according to patient.  No questions or concerns.   Confirmed patient appointment scheduled. Encouraged patient to call with any questions that arise prior to appointment.

## 2015-08-28 ENCOUNTER — Encounter: Payer: Self-pay | Admitting: Surgery

## 2015-08-28 ENCOUNTER — Ambulatory Visit (INDEPENDENT_AMBULATORY_CARE_PROVIDER_SITE_OTHER): Payer: 59 | Admitting: Surgery

## 2015-08-28 VITALS — BP 106/69 | HR 69 | Temp 98.3°F | Ht 72.0 in | Wt 203.0 lb

## 2015-08-28 DIAGNOSIS — K8000 Calculus of gallbladder with acute cholecystitis without obstruction: Secondary | ICD-10-CM

## 2015-08-28 NOTE — Progress Notes (Deleted)
Subjective:     Patient ID: Marc Bolton, male   DOB: Jan 30, 1956, 59 y.o.   MRN: [id number]  HPI   Review of Systems     Objective:   Physical Exam     Assessment:     ***    Plan:     ***

## 2015-08-28 NOTE — Patient Instructions (Signed)
No heavy lifting more than 15 lbs for another four weeks. If you see any redness on your incisions or have fever more than 105 F, please call our office.

## 2015-08-28 NOTE — Progress Notes (Signed)
Patient was seen in office. Denies any concerns at this time. JP drain has minimal output over the last 24 hours, 12cc to be exact. Denies any nausea, vomiting, fever, diarrhea, or constipation. Reports eating a regular diet at home with a good appetite currently. Bowels are moving normally. Call was made to Dr. Marina Gravel for consult regarding this patient, was given verbal order to pull drain at this time.  This was done by myself. Pt tolerated removal well.   Patient would like to return to work on Monday. 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 He was given a note.  He will follow-up next week per Dr. Algernon Huxley request. Appointment was scheduled with Dr. Burt Knack on 11/23 in [place].  Informed patient no lifting greater than 15 pounds, until seen by surgeon next week. Explained that he may shower after 24 hours but not to submerge in water for at least 1 more week.

## 2015-08-30 ENCOUNTER — Encounter: Payer: Self-pay | Admitting: Family Medicine

## 2015-09-01 ENCOUNTER — Encounter: Payer: Self-pay | Admitting: *Deleted

## 2015-09-02 ENCOUNTER — Encounter: Payer: Self-pay | Admitting: Surgery

## 2015-09-02 ENCOUNTER — Ambulatory Visit (INDEPENDENT_AMBULATORY_CARE_PROVIDER_SITE_OTHER): Payer: 59 | Admitting: Surgery

## 2015-09-02 VITALS — BP 129/80 | HR 63 | Temp 97.6°F

## 2015-09-02 DIAGNOSIS — K8000 Calculus of gallbladder with acute cholecystitis without obstruction: Secondary | ICD-10-CM

## 2015-09-02 NOTE — Progress Notes (Signed)
Status post laparoscopic cholecystectomy. He has no complaints. Wounds healing well no erythema no drainage soft nontender abdomen no scleral icterus no jaundice Pathology is reviewed. Patient doing very well recommend follow up on an as-needed basis.

## 2015-09-02 NOTE — Patient Instructions (Signed)
Follow up in our office as needed. 

## 2015-09-10 HISTORY — PX: COLONOSCOPY: SHX174

## 2015-09-16 ENCOUNTER — Ambulatory Visit (AMBULATORY_SURGERY_CENTER): Payer: Self-pay

## 2015-09-16 VITALS — Ht 70.0 in | Wt 204.0 lb

## 2015-09-16 DIAGNOSIS — Z860100 Personal history of colon polyps, unspecified: Secondary | ICD-10-CM

## 2015-09-16 DIAGNOSIS — Z09 Encounter for follow-up examination after completed treatment for conditions other than malignant neoplasm: Secondary | ICD-10-CM

## 2015-09-16 MED ORDER — SUPREP BOWEL PREP KIT 17.5-3.13-1.6 GM/177ML PO SOLN: 1.0000 | Freq: Once | ORAL | Status: DC

## 2015-09-16 NOTE — Progress Notes (Signed)
No allergies to eggs or soy or flu shot No diet/weight loss meds No home oxygen No past problems with anesthesia  Has email and internet; refused emmi

## 2015-09-17 ENCOUNTER — Encounter: Payer: Self-pay | Admitting: Gastroenterology

## 2015-09-29 ENCOUNTER — Ambulatory Visit (AMBULATORY_SURGERY_CENTER): Payer: 59 | Admitting: Gastroenterology

## 2015-09-29 ENCOUNTER — Encounter: Payer: Self-pay | Admitting: Gastroenterology

## 2015-09-29 VITALS — BP 102/69 | HR 57 | Temp 97.4°F | Resp 20 | Ht 70.0 in | Wt 204.0 lb

## 2015-09-29 DIAGNOSIS — D122 Benign neoplasm of ascending colon: Secondary | ICD-10-CM

## 2015-09-29 DIAGNOSIS — Z86010 Personal history of colonic polyps: Secondary | ICD-10-CM | POA: Diagnosis present

## 2015-09-29 DIAGNOSIS — D125 Benign neoplasm of sigmoid colon: Secondary | ICD-10-CM

## 2015-09-29 DIAGNOSIS — D124 Benign neoplasm of descending colon: Secondary | ICD-10-CM

## 2015-09-29 MED ORDER — SODIUM CHLORIDE 0.9 % IV SOLN: 500.0000 mL | INTRAVENOUS | Status: DC

## 2015-09-29 NOTE — Op Note (Signed)
[place]  [address] [place], [id number]   COLONOSCOPY PROCEDURE REPORT  PATIENT: Marc, Bolton  MR#: [id number] BIRTHDATE: 1956-08-31 , 85  yrs. old GENDER: male ENDOSCOPIST: Milus Banister, MD PROCEDURE DATE:  09/29/2015 PROCEDURE:   Colonoscopy, surveillance and Colonoscopy with snare polypectomy First Screening Colonoscopy - Avg.  risk and is 50 yrs.  old or older - No.  Prior Negative Screening - Now for repeat screening. N/A  History of Adenoma - Now for follow-up colonoscopy & has been > or = to 3 yrs.  Yes hx of adenoma.  Has been 3 or more years since last colonoscopy.  Polyps removed today? Yes ASA CLASS:   Class II INDICATIONS:Surveillance due to prior colonic neoplasia and three sub CM adenomas removed 2013 Dr.  Ardis Hughs colonoscopy. MEDICATIONS: Monitored anesthesia care and Propofol 250 mg IV  DESCRIPTION OF PROCEDURE:   After the risks benefits and alternatives of the procedure were thoroughly explained, informed consent was obtained.  The digital rectal exam revealed no abnormalities of the rectum.   The LB [id number]  endoscope was introduced through the anus and advanced to the cecum, which was identified by both the appendix and ileocecal valve. No adverse events experienced.   The quality of the prep was excellent.  The instrument was then slowly withdrawn as the colon was fully examined. Estimated blood loss is zero unless otherwise noted in this procedure report.  COLON FINDINGS: Four sessile polyps ranging between 3-48mm in size were found in the sigmoid colon, descending colon, and ascending colon.  Polypectomies were performed with a cold snare.  The resection was complete, the polyp tissue was completely retrieved and sent to histology.   A semi-pedunculated polyp measuring 8 mm in size was found in the sigmoid colon.  A polypectomy was performed using snare cautery.  The resection was complete, the polyp tissue was completely  retrieved and sent to histology. There was mild diverticulosis noted in the left colon.   The examination was otherwise normal.  Retroflexed views revealed no abnormalities. The time to cecum = 2.7 Withdrawal time = 11.8   The scope was withdrawn and the procedure completed. COMPLICATIONS: There were no immediate complications. ENDOSCOPIC IMPRESSION: 1.   Four sessile polyps ranging between 3-30mm in size were found in the sigmoid colon, descending colon, and ascending colon; polypectomies were performed with a cold snare (jar 1) 2.   Semi-pedunculated polyp was found in the sigmoid colon; polypectomy was performed using snare cautery (jar 2) 3.   Mild diverticulosis was noted in the left colon 4.   The examination was otherwise normal  RECOMMENDATIONS: If the polyp(s) removed today are proven to be adenomatous (pre-cancerous) polyps, you will need a colonoscopy in 3-5 years. You will receive a letter within 1-2 weeks with the results of your biopsy as well as final recommendations.  Please call my office if you have not received a letter after 3 weeks.  eSigned:  Milus Banister, MD 09/29/2015 9:20 AM   cc:  Emi Holes, MD

## 2015-09-29 NOTE — Progress Notes (Signed)
Report to PACU, RN, vss, BBS= Clear.  

## 2015-09-29 NOTE — Patient Instructions (Signed)
YOU HAD AN ENDOSCOPIC PROCEDURE TODAY AT THE [facility]:   Refer to the procedure report that was given to you for any specific questions about what was found during the examination.  If the procedure report does not answer your questions, please call your gastroenterologist to clarify.  If you requested that your care partner not be given the details of your procedure findings, then the procedure report has been included in a sealed envelope for you to review at your convenience later.  YOU SHOULD EXPECT: Some feelings of bloating in the abdomen. Passage of more gas than usual.  Walking can help get rid of the air that was put into your GI tract during the procedure and reduce the bloating. If you had a lower endoscopy (such as a colonoscopy or flexible sigmoidoscopy) you may notice spotting of blood in your stool or on the toilet paper. If you underwent a bowel prep for your procedure, you may not have a normal bowel movement for a few days.  Please Note:  You might notice some irritation and congestion in your nose or some drainage.  This is from the oxygen used during your procedure.  There is no need for concern and it should clear up in a day or so.  SYMPTOMS TO REPORT IMMEDIATELY:   Following lower endoscopy (colonoscopy or flexible sigmoidoscopy):  Excessive amounts of blood in the stool  Significant tenderness or worsening of abdominal pains  Swelling of the abdomen that is new, acute  Fever of 100F or higher    For urgent or emergent issues, a gastroenterologist can be reached at any hour by calling [phone].   DIET: Your first meal following the procedure should be a small meal and then it is ok to progress to your normal diet. Heavy or fried foods are harder to digest and may make you feel nauseous or bloated.  Likewise, meals heavy in dairy and vegetables can increase bloating.  Drink plenty of fluids but you should avoid alcoholic beverages for 24  hours.  ACTIVITY:  You should plan to take it easy for the rest of today and you should NOT DRIVE or use heavy machinery until tomorrow (because of the sedation medicines used during the test).    FOLLOW UP: Our staff will call the number listed on your records the next business day following your procedure to check on you and address any questions or concerns that you may have regarding the information given to you following your procedure. If we do not reach you, we will leave a message.  However, if you are feeling well and you are not experiencing any problems, there is no need to return our call.  We will assume that you have returned to your regular daily activities without incident.  If any biopsies were taken you will be contacted by phone or by letter within the next 1-3 weeks.  Please call us at [phone] if you have not heard about the biopsies in 3 weeks.    SIGNATURES/CONFIDENTIALITY: You and/or your care partner have signed paperwork which will be entered into your electronic medical record.  These signatures attest to the fact that that the information above on your After Visit Summary has been reviewed and is understood.  Full responsibility of the confidentiality of this discharge information lies with you and/or your care-partner.   Resume medications. Information given on polyps,diverticulosis and high fiber diet. 

## 2015-09-30 ENCOUNTER — Telehealth: Payer: Self-pay | Admitting: *Deleted

## 2015-09-30 NOTE — Telephone Encounter (Signed)
  Follow up Call-  Call back number 09/29/2015  Post procedure Call Back phone  # [phone]  Permission to leave phone message Yes     Patient questions:  Do you have a fever, pain , or abdominal swelling? No. Pain Score  0 *  Have you tolerated food without any problems? Yes.    Have you been able to return to your normal activities? Yes.    Do you have any questions about your discharge instructions: Diet   No. Medications  No. Follow up visit  No.  Do you have questions or concerns about your Care? No.  Actions: * If pain score is 4 or above: No action needed, pain <4.

## 2015-10-09 ENCOUNTER — Encounter: Payer: Self-pay | Admitting: Gastroenterology

## 2015-10-13 ENCOUNTER — Encounter: Payer: Self-pay | Admitting: Family Medicine

## 2015-12-18 ENCOUNTER — Encounter: Payer: Self-pay | Admitting: Family Medicine

## 2015-12-18 MED ORDER — DICLOFENAC SODIUM 1 % TD GEL: 1.0000 "application " | Freq: Three times a day (TID) | TRANSDERMAL | Status: AC | PRN

## 2015-12-18 NOTE — Telephone Encounter (Signed)
See My chart message

## 2016-02-09 ENCOUNTER — Other Ambulatory Visit: Payer: Self-pay | Admitting: Family Medicine

## 2016-02-22 ENCOUNTER — Telehealth: Payer: Self-pay | Admitting: Family Medicine

## 2016-02-22 DIAGNOSIS — Z1159 Encounter for screening for other viral diseases: Secondary | ICD-10-CM

## 2016-02-22 NOTE — Telephone Encounter (Signed)
Pt made a cpx appointment on 7/5 and wanted to know if he could pick up a script to get his labs done at [facility] Please advise when this is ready for pick up

## 2016-02-27 NOTE — Telephone Encounter (Addendum)
Rx written and in [name]'s box. CMP, FLP, PSA

## 2016-02-29 NOTE — Telephone Encounter (Signed)
Patient notified by telephone that written order is up front ready for pickup. Patient stated that he will have his wife pick it up for him.

## 2016-03-25 ENCOUNTER — Other Ambulatory Visit: Payer: Self-pay | Admitting: Family Medicine

## 2016-03-25 NOTE — Addendum Note (Signed)
Addended by: Ria Bush on: 03/25/2016 02:03 PM   Modules accepted: Orders

## 2016-03-25 NOTE — Telephone Encounter (Signed)
Ok to do - plz add and fax.

## 2016-03-25 NOTE — Telephone Encounter (Signed)
Order faxed to [facility].

## 2016-03-25 NOTE — Telephone Encounter (Signed)
Pt left /vm; pt had labs drawn today at [facility] for upcoming CPX on 04/13/16. Lab drew extra tube of blood; pt request Hep C order faxed to [phone].

## 2016-03-26 LAB — COMPREHENSIVE METABOLIC PANEL
ALBUMIN: 4 g/dL (ref 3.5–5.5)
ALK PHOS: 60 IU/L (ref 39–117)
ALT: 23 IU/L (ref 0–44)
AST: 21 IU/L (ref 0–40)
Albumin/Globulin Ratio: 1.5 (ref 1.2–2.2)
BILIRUBIN TOTAL: 0.7 mg/dL (ref 0.0–1.2)
BUN / CREAT RATIO: 17 (ref 9–20)
BUN: 18 mg/dL (ref 6–24)
CO2: 23 mmol/L (ref 18–29)
CREATININE: 1.07 mg/dL (ref 0.76–1.27)
Calcium: 9.3 mg/dL (ref 8.7–10.2)
Chloride: 101 mmol/L (ref 96–106)
GFR calc Af Amer: 87 mL/min/{1.73_m2} (ref >59)
GFR calc non Af Amer: 76 mL/min/{1.73_m2} (ref >59)
GLOBULIN, TOTAL: 2.6 g/dL (ref 1.5–4.5)
Glucose: 95 mg/dL (ref 65–99)
Potassium: 4.3 mmol/L (ref 3.5–5.2)
SODIUM: 141 mmol/L (ref 134–144)
Total Protein: 6.6 g/dL (ref 6.0–8.5)

## 2016-03-26 LAB — LIPID PANEL W/O CHOL/HDL RATIO
CHOLESTEROL TOTAL: 205 mg/dL — AB (ref 100–199)
HDL: 57 mg/dL (ref >39)
LDL Calculated: 127 mg/dL — ABNORMAL HIGH (ref 0–99)
Triglycerides: 106 mg/dL (ref 0–149)
VLDL Cholesterol Cal: 21 mg/dL (ref 5–40)

## 2016-03-26 LAB — HEPATITIS C ANTIBODY

## 2016-03-26 LAB — PSA: Prostate Specific Ag, Serum: 0.7 ng/mL (ref 0.0–4.0)

## 2016-04-06 ENCOUNTER — Other Ambulatory Visit: Payer: Self-pay | Admitting: Family Medicine

## 2016-04-08 ENCOUNTER — Other Ambulatory Visit: Payer: 59

## 2016-04-13 ENCOUNTER — Encounter: Payer: Self-pay | Admitting: Family Medicine

## 2016-04-13 ENCOUNTER — Ambulatory Visit (INDEPENDENT_AMBULATORY_CARE_PROVIDER_SITE_OTHER): Payer: 59 | Admitting: Family Medicine

## 2016-04-13 VITALS — BP 110/70 | HR 64 | Temp 98.1°F | Ht 71.0 in | Wt 212.0 lb

## 2016-04-13 DIAGNOSIS — E785 Hyperlipidemia, unspecified: Secondary | ICD-10-CM

## 2016-04-13 DIAGNOSIS — N401 Enlarged prostate with lower urinary tract symptoms: Secondary | ICD-10-CM

## 2016-04-13 DIAGNOSIS — N138 Other obstructive and reflux uropathy: Secondary | ICD-10-CM

## 2016-04-13 DIAGNOSIS — Z0001 Encounter for general adult medical examination with abnormal findings: Secondary | ICD-10-CM

## 2016-04-13 DIAGNOSIS — Z0000 Encounter for general adult medical examination without abnormal findings: Secondary | ICD-10-CM

## 2016-04-13 LAB — POC URINALSYSI DIPSTICK (AUTOMATED)
BILIRUBIN UA: NEGATIVE
Blood, UA: NEGATIVE
GLUCOSE UA: NEGATIVE
KETONES UA: NEGATIVE
LEUKOCYTES UA: NEGATIVE
Nitrite, UA: NEGATIVE
Protein, UA: NEGATIVE
Spec Grav, UA: 1.025
Urobilinogen, UA: 0.2
pH, UA: 6

## 2016-04-13 MED ORDER — FINASTERIDE 5 MG PO TABS: 5.0000 mg | ORAL_TABLET | Freq: Every day | ORAL | Status: DC

## 2016-04-13 NOTE — Progress Notes (Signed)
Pre visit review using our clinic review tool, if applicable. No additional management support is needed unless otherwise documented below in the visit note. 

## 2016-04-13 NOTE — Assessment & Plan Note (Signed)
Preventative protocols reviewed and updated unless pt declined. Discussed healthy diet and lifestyle.  

## 2016-04-13 NOTE — Assessment & Plan Note (Signed)
Flomax helpful, but continues with LUTS symptoms. Minimal enlargement on exam, PSA stable. Will add finasteride if needed. Check UA today.

## 2016-04-13 NOTE — Progress Notes (Signed)
BP 110/70 mmHg  Pulse 64  Temp(Src) 98.1 F (36.7 C) (Oral)  Ht 5\' 11"  (1.803 m)  Wt 212 lb (96.163 kg)  BMI 29.58 kg/m2   CC: CPE  Subjective:    Patient ID: Marc Bolton, male    DOB: [birth year], 60 y.o.   MRN: [id number]  HPI: Marc Bolton is a 60 y.o. male presenting on 04/13/2016 for Annual Exam   Daughter and grandchildren living with them over last 2 months. Some dietary indiscretions and less active due to this.   BPH with LUTS - on flomax for last 1.5 yrs. Not as effective.   Preventative: COLONOSCOPY Date: 09/2015 TA, diverticulosis, rpt 3 yrs Ardis Hughs) Prostate - gets checked yearly. Nocturia - several times a night. Daytime urgency and frequency as well. No dysuria. Normal stream. Daily flomax.  Flu - yearly Tdap - 2013 Shingles shot - 10/2013 Seat belt use discussed Sunscreen use discussed. No changing moles.  Caffeine: 3 cups coffee/day  Married; Lives with wife  3 children  Data processor for [employer] Activity: works out 3x/wk, runs 15-20 miles/wk  Diet: good water, fruits/vegetables daily, very little fried food/fatty food, fish weekly   Relevant past medical, surgical, family and social history reviewed and updated as indicated. Interim medical history since our last visit reviewed. Allergies and medications reviewed and updated. Current Outpatient Prescriptions on File Prior to Visit  Medication Sig  . aspirin 81 MG tablet Take 81 mg by mouth every Monday, Wednesday, and Friday.   . diclofenac sodium (VOLTAREN) 1 % GEL Apply 1 application topically 3 (three) times daily as needed (AAA).  . Multiple Vitamin (MULTIVITAMIN) tablet Take 1 tablet by mouth daily.  . tamsulosin (FLOMAX) 0.4 MG CAPS capsule Take one capsule daily **MUST HAVE PHYSICAL FOR FURTHER REFILLS**   No current facility-administered medications on file prior to visit.    Review of Systems  Constitutional: Negative for fever, chills, activity change, appetite change, fatigue and  unexpected weight change.  HENT: Negative for hearing loss.   Eyes: Negative for visual disturbance.  Respiratory: Negative for cough, chest tightness, shortness of breath and wheezing.   Cardiovascular: Negative for chest pain, palpitations and leg swelling.  Gastrointestinal: Negative for nausea, vomiting, abdominal pain, diarrhea, constipation, blood in stool and abdominal distention.  Genitourinary: Negative for hematuria and difficulty urinating.  Musculoskeletal: Negative for myalgias, arthralgias and neck pain.  Skin: Negative for rash.  Neurological: Negative for dizziness, seizures, syncope and headaches.  Hematological: Negative for adenopathy. Does not bruise/bleed easily.  Psychiatric/Behavioral: Negative for dysphoric mood. The patient is not nervous/anxious.    Per HPI unless specifically indicated in ROS section     Objective:    BP 110/70 mmHg  Pulse 64  Temp(Src) 98.1 F (36.7 C) (Oral)  Ht 5\' 11"  (1.803 m)  Wt 212 lb (96.163 kg)  BMI 29.58 kg/m2  Wt Readings from Last 3 Encounters:  04/13/16 212 lb (96.163 kg)  09/29/15 204 lb (92.534 kg)  09/16/15 204 lb (92.534 kg)    Physical Exam  Constitutional: He is oriented to person, place, and time. He appears well-developed and well-nourished. No distress.  HENT:  Head: Normocephalic and atraumatic.  Right Ear: Hearing, tympanic membrane, external ear and ear canal normal.  Left Ear: Hearing, tympanic membrane, external ear and ear canal normal.  Nose: Nose normal.  Mouth/Throat: Uvula is midline, oropharynx is clear and moist and mucous membranes are normal. No oropharyngeal exudate, posterior oropharyngeal edema or posterior oropharyngeal erythema.  Eyes: Conjunctivae  and EOM are normal. Pupils are equal, round, and reactive to light. No scleral icterus.  Neck: Normal range of motion. Neck supple. No thyromegaly present.  Cardiovascular: Normal rate, regular rhythm, normal heart sounds and intact distal pulses.     No murmur heard. Pulses:      Radial pulses are 2+ on the right side, and 2+ on the left side.  Pulmonary/Chest: Effort normal and breath sounds normal. No respiratory distress. He has no wheezes. He has no rales.  Abdominal: Soft. Bowel sounds are normal. He exhibits no distension and no mass. There is no tenderness. There is no rebound and no guarding.  Genitourinary: Rectum normal and prostate normal. Rectal exam shows no external hemorrhoid, no internal hemorrhoid, no fissure, no mass, no tenderness and anal tone normal. Prostate is not enlarged and not tender.  Musculoskeletal: Normal range of motion. He exhibits no edema.  Lymphadenopathy:    He has no cervical adenopathy.  Neurological: He is alert and oriented to person, place, and time.  CN grossly intact, station and gait intact  Skin: Skin is warm and dry. No rash noted.  Psychiatric: He has a normal mood and affect. His behavior is normal. Judgment and thought content normal.  Nursing note and vitals reviewed.  Results for orders placed or performed in visit on 03/25/16  Hepatitis C antibody  Result Value Ref Range   Hep C Virus Ab <0.1 0.0 - 0.9 s/co ratio      Assessment & Plan:   Problem List Items Addressed This Visit    HLD (hyperlipidemia)    Chronic, stable. 41yr ASCVD risk estimator of 5.7% reviewed with patient. Suggested decrease aspirin to MWF, stop calcium supplement.       Healthcare maintenance - Primary    Preventative protocols reviewed and updated unless pt declined. Discussed healthy diet and lifestyle.       BPH with obstruction/lower urinary tract symptoms    Flomax helpful, but continues with LUTS symptoms. Minimal enlargement on exam, PSA stable. Will add finasteride if needed. Check UA today.       Relevant Medications   finasteride (PROSCAR) 5 MG tablet       Follow up plan: Return in about 1 year (around 04/13/2017) for annual exam, prior fasting for blood work.  Ria Bush, MD

## 2016-04-13 NOTE — Addendum Note (Signed)
Addended by: Royann Shivers A on: 04/13/2016 10:06 AM   Modules accepted: Orders

## 2016-04-13 NOTE — Assessment & Plan Note (Addendum)
Chronic, stable. 41yr ASCVD risk estimator of 5.7% reviewed with patient. Suggested decrease aspirin to MWF, stop calcium supplement.

## 2016-04-13 NOTE — Patient Instructions (Addendum)
Urinalysis today If no improvement in voiding symptoms, try finasteride prescription printed today. Work on [employer] for better cholesterol control. Return as needed or in 1 year for next physical.  Health Maintenance, Male A healthy lifestyle and preventative care can promote health and wellness.  Maintain regular health, dental, and eye exams.  Eat a healthy diet. Foods like vegetables, fruits, whole grains, low-fat dairy products, and lean protein foods contain the nutrients you need and are low in calories. Decrease your intake of foods high in solid fats, added sugars, and salt. Get information about a proper diet from your health care provider, if necessary.  Regular physical exercise is one of the most important things you can do for your health. Most adults should get at least 150 minutes of moderate-intensity exercise (any activity that increases your heart rate and causes you to sweat) each week. In addition, most adults need muscle-strengthening exercises on 2 or more days a week.   Maintain a healthy weight. The body mass index (BMI) is a screening tool to identify possible weight problems. It provides an estimate of body fat based on height and weight. Your health care provider can find your BMI and can help you achieve or maintain a healthy weight. For males 20 years and older:  A BMI below 18.5 is considered underweight.  A BMI of 18.5 to 24.9 is normal.  A BMI of 25 to 29.9 is considered overweight.  A BMI of 30 and above is considered obese.  Maintain normal blood lipids and cholesterol by exercising and minimizing your intake of saturated fat. Eat a balanced diet with plenty of fruits and vegetables. Blood tests for lipids and cholesterol should begin at age 37 and be repeated every 5 years. If your lipid or cholesterol levels are high, you are over age 45, or you are at high risk for heart disease, you may need your cholesterol levels checked more frequently.Ongoing  high lipid and cholesterol levels should be treated with medicines if diet and exercise are not working.  If you smoke, find out from your health care provider how to quit. If you do not use tobacco, do not start.  Lung cancer screening is recommended for adults aged 31-80 years who are at high risk for developing lung cancer because of a history of smoking. A yearly low-dose CT scan of the lungs is recommended for people who have at least a 30-pack-year history of smoking and are current smokers or have quit within the past 15 years. A pack year of smoking is smoking an average of 1 pack of cigarettes a day for 1 year (for example, a 30-pack-year history of smoking could mean smoking 1 pack a day for 30 years or 2 packs a day for 15 years). Yearly screening should continue until the smoker has stopped smoking for at least 15 years. Yearly screening should be stopped for people who develop a health problem that would prevent them from having lung cancer treatment.  If you choose to drink alcohol, do not have more than 2 drinks per day. One drink is considered to be 12 oz (360 mL) of beer, 5 oz (150 mL) of wine, or 1.5 oz (45 mL) of liquor.  Avoid the use of street drugs. Do not share needles with anyone. Ask for help if you need support or instructions about stopping the use of drugs.  High blood pressure causes heart disease and increases the risk of stroke. High blood pressure is more likely to  develop in:  People who have blood pressure in the end of the normal range (100-139/85-89 mm Hg).  People who are overweight or obese.  People who are African American.  If you are 33-10 years of age, have your blood pressure checked every 3-5 years. If you are 73 years of age or older, have your blood pressure checked every year. You should have your blood pressure measured twice--once when you are at a hospital or clinic, and once when you are not at a hospital or clinic. Record the average of the two  measurements. To check your blood pressure when you are not at a hospital or clinic, you can use:  An automated blood pressure machine at a pharmacy.  A home blood pressure monitor.  If you are 69-67 years old, ask your health care provider if you should take aspirin to prevent heart disease.  Diabetes screening involves taking a blood sample to check your fasting blood sugar level. This should be done once every 3 years after age 61 if you are at a normal weight and without risk factors for diabetes. Testing should be considered at a younger age or be carried out more frequently if you are overweight and have at least 1 risk factor for diabetes.  Colorectal cancer can be detected and often prevented. Most routine colorectal cancer screening begins at the age of 41 and continues through age 55. However, your health care provider may recommend screening at an earlier age if you have risk factors for colon cancer. On a yearly basis, your health care provider may provide home test kits to check for hidden blood in the stool. A small camera at the end of a tube may be used to directly examine the colon (sigmoidoscopy or colonoscopy) to detect the earliest forms of colorectal cancer. Talk to your health care provider about this at age 39 when routine screening begins. A direct exam of the colon should be repeated every 5-10 years through age 66, unless early forms of precancerous polyps or small growths are found.  People who are at an increased risk for hepatitis B should be screened for this virus. You are considered at high risk for hepatitis B if:  You were born in a country where hepatitis B occurs often. Talk with your health care provider about which countries are considered high risk.  Your parents were born in a high-risk country and you have not received a shot to protect against hepatitis B (hepatitis B vaccine).  You have HIV or AIDS.  You use needles to inject street drugs.  You live  with, or have sex with, someone who has hepatitis B.  You are a man who has sex with other men (MSM).  You get hemodialysis treatment.  You take certain medicines for conditions like cancer, organ transplantation, and autoimmune conditions.  Hepatitis C blood testing is recommended for all people born from [birth year] through 1965 and any individual with known risk factors for hepatitis C.  Healthy men should no longer receive prostate-specific antigen (PSA) blood tests as part of routine cancer screening. Talk to your health care provider about prostate cancer screening.  Testicular cancer screening is not recommended for adolescents or adult males who have no symptoms. Screening includes self-exam, a health care provider exam, and other screening tests. Consult with your health care provider about any symptoms you have or any concerns you have about testicular cancer.  Practice safe sex. Use condoms and avoid high-risk sexual practices to reduce  the spread of sexually transmitted infections (STIs).  You should be screened for STIs, including gonorrhea and chlamydia if:  You are sexually active and are younger than 24 years.  You are older than 24 years, and your health care provider tells you that you are at risk for this type of infection.  Your sexual activity has changed since you were last screened, and you are at an increased risk for chlamydia or gonorrhea. Ask your health care provider if you are at risk.  If you are at risk of being infected with HIV, it is recommended that you take a prescription medicine daily to prevent HIV infection. This is called pre-exposure prophylaxis (PrEP). You are considered at risk if:  You are a man who has sex with other men (MSM).  You are a heterosexual man who is sexually active with multiple partners.  You take drugs by injection.  You are sexually active with a partner who has HIV.  Talk with your health care provider about whether you are at  high risk of being infected with HIV. If you choose to begin PrEP, you should first be tested for HIV. You should then be tested every 3 months for as long as you are taking PrEP.  Use sunscreen. Apply sunscreen liberally and repeatedly throughout the day. You should seek shade when your shadow is shorter than you. Protect yourself by wearing long sleeves, pants, a wide-brimmed hat, and sunglasses year round whenever you are outdoors.  Tell your health care provider of new moles or changes in moles, especially if there is a change in shape or color. Also, tell your health care provider if a mole is larger than the size of a pencil eraser.  A one-time screening for abdominal aortic aneurysm (AAA) and surgical repair of large AAAs by ultrasound is recommended for men aged 32-75 years who are current or former smokers.  Stay current with your vaccines (immunizations).   This information is not intended to replace advice given to you by your health care provider. Make sure you discuss any questions you have with your health care provider.   Document Released: 03/24/2008 Document Revised: 10/17/2014 Document Reviewed: 02/21/2011 Elsevier Interactive Patient Education [employer].

## 2016-04-29 ENCOUNTER — Other Ambulatory Visit: Payer: Self-pay | Admitting: Family Medicine

## 2016-06-15 ENCOUNTER — Other Ambulatory Visit: Payer: Self-pay | Admitting: *Deleted

## 2016-06-15 MED ORDER — FINASTERIDE 5 MG PO TABS: 5.0000 mg | ORAL_TABLET | Freq: Every day | ORAL | 1 refills | Status: DC

## 2016-06-30 IMAGING — NM NM HEPATO W/GB/PHARM/[PERSON_NAME]
2 series · 12 of 12 positions shown · non-contrast
Comparison: CT and ultrasound 08/22/2015.

CLINICAL DATA: Umbilical and lower abdominal pain 3 days.

EXAM:
NUCLEAR MEDICINE HEPATOBILIARY IMAGING
TECHNIQUE: Sequential images of the abdomen were obtained [DATE] minutes
following intravenous administration of radiopharmaceutical.
RADIOPHARMACEUTICALS:  5.0 mCi Qc-EEm  Choletec IV

[Series 1000: hepatobiliary scan · 9.59mm/px · 6 of 30 frames shown (1 of 2)]
[frame 3/30]
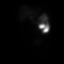
[frame 8/30]
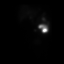
[frame 13/30]
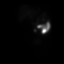
[frame 18/30]
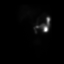
[frame 23/30]
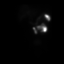
[frame 28/30]
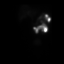

[Series 1000: hepatobiliary scan · 9.59mm/px · 6 of 60 frames shown (2 of 2)]
[frame 6/60]
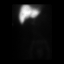
[frame 16/60]
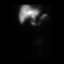
[frame 26/60]
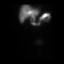
[frame 36/60]
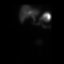
[frame 46/60]
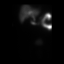
[frame 56/60]
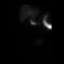

[12 of 12 positions shown; findings below may reference images not displayed]

FINDINGS: Examination demonstrates normal uniform uptake of radiotracer by the
liver. There is biliary to bowel transit at 15 minutes. No evidence
of gallbladder filling at 1 hour.

3.8 mg of morphine was given IV after 1 hour. Imaging was continued
up to 1 hour post morphine injection. Additional images again
demonstrate no evidence of gallbladder filling compatible with an
obstructed cystic duct and acute cholecystitis.
IMPRESSION: Findings compatible with acute cholecystitis.

## 2016-10-26 ENCOUNTER — Encounter: Payer: Self-pay | Admitting: Family Medicine

## 2016-10-28 ENCOUNTER — Encounter: Payer: Self-pay | Admitting: Family Medicine

## 2016-11-05 ENCOUNTER — Other Ambulatory Visit: Payer: Self-pay | Admitting: Family Medicine

## 2017-04-03 ENCOUNTER — Other Ambulatory Visit: Payer: Self-pay | Admitting: Family Medicine

## 2017-05-02 ENCOUNTER — Ambulatory Visit (INDEPENDENT_AMBULATORY_CARE_PROVIDER_SITE_OTHER): Payer: 59 | Admitting: Family Medicine

## 2017-05-02 ENCOUNTER — Encounter: Payer: Self-pay | Admitting: Family Medicine

## 2017-05-02 VITALS — BP 114/78 | HR 61 | Temp 98.1°F | Ht 70.0 in | Wt 208.0 lb

## 2017-05-02 DIAGNOSIS — Z0000 Encounter for general adult medical examination without abnormal findings: Secondary | ICD-10-CM

## 2017-05-02 DIAGNOSIS — N401 Enlarged prostate with lower urinary tract symptoms: Secondary | ICD-10-CM | POA: Diagnosis not present

## 2017-05-02 DIAGNOSIS — M25521 Pain in right elbow: Secondary | ICD-10-CM | POA: Diagnosis not present

## 2017-05-02 DIAGNOSIS — N138 Other obstructive and reflux uropathy: Secondary | ICD-10-CM | POA: Diagnosis not present

## 2017-05-02 DIAGNOSIS — E785 Hyperlipidemia, unspecified: Secondary | ICD-10-CM

## 2017-05-02 NOTE — Assessment & Plan Note (Signed)
Preventative protocols reviewed and updated unless pt declined. Discussed healthy diet and lifestyle.  

## 2017-05-02 NOTE — Patient Instructions (Signed)
Continue current medicines Ok to try voltaren to elbow. Possible arthritis. If no better, come in for xray.  Good to see you today, call us with questions. Return as needed or in 1 year for next physical.  Health Maintenance, Male A healthy lifestyle and preventive care is important for your health and wellness. Ask your health care provider about what schedule of regular examinations is right for you. What should I know about weight and diet? Eat a Healthy Diet  Eat plenty of vegetables, fruits, whole grains, low-fat dairy products, and lean protein.  Do not eat a lot of foods high in solid fats, added sugars, or salt.  Maintain a Healthy Weight Regular exercise can help you achieve or maintain a healthy weight. You should:  Do at least 150 minutes of exercise each week. The exercise should increase your heart rate and make you sweat (moderate-intensity exercise).  Do strength-training exercises at least twice a week.  Watch Your Levels of Cholesterol and Blood Lipids  Have your blood tested for lipids and cholesterol every 5 years starting at 61 years of age. If you are at high risk for heart disease, you should start having your blood tested when you are 61 years old. You may need to have your cholesterol levels checked more often if: ? Your lipid or cholesterol levels are high. ? You are older than 62 years of age. ? You are at high risk for heart disease.  What should I know about cancer screening? Many types of cancers can be detected early and may often be prevented. Lung Cancer  You should be screened every year for lung cancer if: ? You are a current smoker who has smoked for at least 30 years. ? You are a former smoker who has quit within the past 15 years.  Talk to your health care provider about your screening options, when you should start screening, and how often you should be screened.  Colorectal Cancer  Routine colorectal cancer screening usually begins at 61  years of age and should be repeated every 5-10 years until you are 61 years old. You may need to be screened more often if early forms of precancerous polyps or small growths are found. Your health care provider may recommend screening at an earlier age if you have risk factors for colon cancer.  Your health care provider may recommend using home test kits to check for hidden blood in the stool.  A small camera at the end of a tube can be used to examine your colon (sigmoidoscopy or colonoscopy). This checks for the earliest forms of colorectal cancer.  Prostate and Testicular Cancer  Depending on your age and overall health, your health care provider may do certain tests to screen for prostate and testicular cancer.  Talk to your health care provider about any symptoms or concerns you have about testicular or prostate cancer.  Skin Cancer  Check your skin from head to toe regularly.  Tell your health care provider about any new moles or changes in moles, especially if: ? There is a change in a mole's size, shape, or color. ? You have a mole that is larger than a pencil eraser.  Always use sunscreen. Apply sunscreen liberally and repeat throughout the day.  Protect yourself by wearing long sleeves, pants, a wide-brimmed hat, and sunglasses when outside.  What should I know about heart disease, diabetes, and high blood pressure?  If you are 45-4 years of age, have your blood pressure  checked every 3-5 years. If you are 55 years of age or older, have your blood pressure checked every year. You should have your blood pressure measured twice-once when you are at a hospital or clinic, and once when you are not at a hospital or clinic. Record the average of the two measurements. To check your blood pressure when you are not at a hospital or clinic, you can use: ? An automated blood pressure machine at a pharmacy. ? A home blood pressure monitor.  Talk to your health care provider about your  target blood pressure.  If you are between 4-24 years old, ask your health care provider if you should take aspirin to prevent heart disease.  Have regular diabetes screenings by checking your fasting blood sugar level. ? If you are at a normal weight and have a low risk for diabetes, have this test once every three years after the age of 98. ? If you are overweight and have a high risk for diabetes, consider being tested at a younger age or more often.  A one-time screening for abdominal aortic aneurysm (AAA) by ultrasound is recommended for men aged 72-75 years who are current or former smokers. What should I know about preventing infection? Hepatitis B If you have a higher risk for hepatitis B, you should be screened for this virus. Talk with your health care provider to find out if you are at risk for hepatitis B infection. Hepatitis C Blood testing is recommended for:  Everyone born from [birth year] through 1965.  Anyone with known risk factors for hepatitis C.  Sexually Transmitted Diseases (STDs)  You should be screened each year for STDs including gonorrhea and chlamydia if: ? You are sexually active and are younger than 61 years of age. ? You are older than 61 years of age and your health care provider tells you that you are at risk for this type of infection. ? Your sexual activity has changed since you were last screened and you are at an increased risk for chlamydia or gonorrhea. Ask your health care provider if you are at risk.  Talk with your health care provider about whether you are at high risk of being infected with HIV. Your health care provider may recommend a prescription medicine to help prevent HIV infection.  What else can I do?  Schedule regular health, dental, and eye exams.  Stay current with your vaccines (immunizations).  Do not use any tobacco products, such as cigarettes, chewing tobacco, and e-cigarettes. If you need help quitting, ask your health care  provider.  Limit alcohol intake to no more than 2 drinks per day. One drink equals 12 ounces of beer, 5 ounces of wine, or 1 ounces of hard liquor.  Do not use street drugs.  Do not share needles.  Ask your health care provider for help if you need support or information about quitting drugs.  Tell your health care provider if you often feel depressed.  Tell your health care provider if you have ever been abused or do not feel safe at home. This information is not intended to replace advice given to you by your health care provider. Make sure you discuss any questions you have with your health care provider. Document Released: 03/24/2008 Document Revised: 05/25/2016 Document Reviewed: 06/30/2015 Elsevier Interactive Patient Education  [employer].

## 2017-05-02 NOTE — Assessment & Plan Note (Signed)
Not consistent with lateral epicondylitis. With limited ROM anticipate arthritis related. rec voltaren gel to elbow, and return if not improving for further evaluation/xray.

## 2017-05-02 NOTE — Progress Notes (Addendum)
BP 114/78 (BP Location: Left Arm, Patient Position: Sitting, Cuff Size: Large)   Pulse 61   Temp 98.1 F (36.7 C) (Oral)   Ht 5\' 10"  (1.778 m)   Wt 208 lb (94.3 kg)   SpO2 98%   BMI 29.84 kg/m    CC: CPE Subjective:    Patient ID: Marc Bolton, male    DOB: [birth year], 61 y.o.   MRN: [id number]  HPI: Marc Bolton is a 61 y.o. male presenting on 05/02/2017 for Annual Exam; Benign Prostatic Hypertrophy (Still having urinary frequency); and Elbow Pain   BPH - on flomax and finasteride. Drinking 3 cups coffee a day. Nocturia x2.  R lateral elbow pain present for 3 months. Not affecting golf game.   Preventative: COLONOSCOPY Date: 09/2015 TA, diverticulosis, rpt 3 yrs Ardis Hughs)  Prostate - gets checked yearly.  Flu yearly Tdap - 2013 zostavax - 10/2013 shingrix - discussed Seat belt use discussed Sunscreen use discussed. No changing moles. 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 Interim medical history since our last visit reviewed. Allergies and medications reviewed and updated. Outpatient Medications Prior to Visit  Medication Sig Dispense Refill  . aspirin 81 MG tablet Take 81 mg by mouth every Monday, Wednesday, and Friday.     . cholecalciferol (VITAMIN D) 1000 units tablet Take 1,000 Units by mouth daily.    . diclofenac sodium (VOLTAREN) 1 % GEL Apply 1 application topically 3 (three) times daily as needed (AAA). 1 Tube 1  . finasteride (PROSCAR) 5 MG tablet TAKE 1 TABLET BY MOUTH  DAILY 90 tablet 1  . Multiple Vitamin (MULTIVITAMIN) tablet Take 1 tablet by mouth daily.    . tamsulosin (FLOMAX) 0.4 MG CAPS capsule TAKE 1 CAPSULE BY MOUTH  DAILY  90 capsule 1   No facility-administered medications prior to visit.      Per HPI unless specifically indicated in ROS section below Review of Systems  Constitutional: Negative for activity change, appetite change, chills, fatigue, fever and unexpected weight change.  HENT: Negative for hearing loss.   Eyes: Negative for visual disturbance.  Respiratory: Negative for cough, chest tightness, shortness of breath and wheezing.   Cardiovascular: Negative for chest pain, palpitations and leg swelling.  Gastrointestinal: Negative for abdominal distention, abdominal pain, blood in stool, constipation, diarrhea, nausea and vomiting.  Genitourinary: Negative for difficulty urinating and hematuria.  Musculoskeletal: Negative for arthralgias, myalgias and neck pain.  Skin: Negative for rash.  Neurological: Negative for dizziness, seizures, syncope and headaches.  Hematological: Negative for adenopathy. Does not bruise/bleed easily.  Psychiatric/Behavioral: Negative for dysphoric mood. The patient is not nervous/anxious.        Objective:    BP 114/78 (BP Location: Left Arm, Patient Position: Sitting, Cuff Size: Large)   Pulse 61   Temp 98.1 F (36.7 C) (Oral)   Ht 5\' 10"  (1.778 m)   Wt 208 lb (94.3 kg)   SpO2 98%   BMI 29.84 kg/m   Wt Readings from Last 3 Encounters:  05/02/17 208 lb (94.3 kg)  04/13/16 212 lb (96.2 kg)  09/29/15 204 lb (92.5 kg)    Physical Exam  Constitutional: He is oriented to person, place, and time. He  appears well-developed and well-nourished. No distress.  HENT:  Head: Normocephalic and atraumatic.  Right Ear: Hearing, tympanic membrane, external ear and ear canal normal.  Left Ear: Hearing, tympanic membrane, external ear and ear canal normal.  Nose: Nose normal.  Mouth/Throat: Uvula is midline, oropharynx is clear and moist and mucous membranes are normal. No oropharyngeal exudate, posterior oropharyngeal edema or posterior oropharyngeal erythema.  Eyes:  Pupils are equal, round, and reactive to light. Conjunctivae and EOM are normal. No scleral icterus.  Neck: Normal range of motion. Neck supple. No thyromegaly present.  Cardiovascular: Normal rate, regular rhythm, normal heart sounds and intact distal pulses.   No murmur heard. Pulses:      Radial pulses are 2+ on the right side, and 2+ on the left side.  Pulmonary/Chest: Effort normal and breath sounds normal. No respiratory distress. He has no wheezes. He has no rales.  Abdominal: Soft. Bowel sounds are normal. He exhibits no distension and no mass. There is no tenderness. There is no rebound and no guarding.  Genitourinary: Rectum normal. Rectal exam shows no external hemorrhoid, no internal hemorrhoid, no fissure, no mass, no tenderness and anal tone normal. Prostate is enlarged (35gm). Prostate is not tender.  Musculoskeletal: Normal range of motion. He exhibits no edema.  No significant pain to palpation of R elbow including lateral epicondyle. Limited ROM in flexion and extension No erythema warmth  Lymphadenopathy:    He has no cervical adenopathy.  Neurological: He is alert and oriented to person, place, and time.  CN grossly intact, station and gait intact  Skin: Skin is warm and dry. No rash noted.  Psychiatric: He has a normal mood and affect. His behavior is normal. Judgment and thought content normal.  Nursing note and vitals reviewed.      Assessment & Plan:   Problem List Items Addressed This Visit    BPH with obstruction/lower urinary tract symptoms    Ongoing mild LUTS. Not bothersome to patient. Desires to avoid further intervention at this time. DRE stable. Update PSA today.  I-PSS 7/3      Relevant Orders   PSA   Healthcare maintenance - Primary    Preventative protocols reviewed and updated unless pt declined. Discussed healthy diet and lifestyle.       HLD (hyperlipidemia)    Update FLP. The 10-year ASCVD risk score Mikey Bussing DC Brooke Bonito., et al., 2013) is:  6.6%   Values used to calculate the score:     Age: 47 years     Sex: Male     Is Non-Hispanic African American: No     Diabetic: No     Tobacco smoker: No     Systolic Blood Pressure: 545 mmHg     Is BP treated: No     HDL Cholesterol: 57 mg/dL     Total Cholesterol: 205 mg/dL       Relevant Orders   Lipid panel   Basic metabolic panel   Right elbow pain    Not consistent with lateral epicondylitis. With limited ROM anticipate arthritis related. rec voltaren gel to elbow, and return if not improving for further evaluation/xray.          Follow up plan: Return in about 1 year (around 05/02/2018) for annual exam, prior fasting for blood work.  Ria Bush, MD

## 2017-05-02 NOTE — Assessment & Plan Note (Signed)
Update FLP. The 10-year ASCVD risk score Mikey Bussing DC Brooke Bonito., et al., 2013) is: 6.6%   Values used to calculate the score:     Age: 61 years     Sex: Male     Is Non-Hispanic African American: No     Diabetic: No     Tobacco smoker: No     Systolic Blood Pressure: 786 mmHg     Is BP treated: No     HDL Cholesterol: 57 mg/dL     Total Cholesterol: 205 mg/dL

## 2017-05-02 NOTE — Assessment & Plan Note (Addendum)
Ongoing mild LUTS. Not bothersome to patient. Desires to avoid further intervention at this time. DRE stable. Update PSA today.  I-PSS 7/3

## 2017-05-03 LAB — BASIC METABOLIC PANEL
BUN / CREAT RATIO: 13 (ref 10–24)
BUN: 13 mg/dL (ref 8–27)
CALCIUM: 9 mg/dL (ref 8.6–10.2)
CHLORIDE: 101 mmol/L (ref 96–106)
CO2: 24 mmol/L (ref 20–29)
Creatinine, Ser: 0.97 mg/dL (ref 0.76–1.27)
GFR calc Af Amer: 98 mL/min/{1.73_m2} (ref >59)
GFR calc non Af Amer: 84 mL/min/{1.73_m2} (ref >59)
GLUCOSE: 91 mg/dL (ref 65–99)
POTASSIUM: 4 mmol/L (ref 3.5–5.2)
Sodium: 139 mmol/L (ref 134–144)

## 2017-05-03 LAB — LIPID PANEL
Chol/HDL Ratio: 3 ratio (ref 0.0–5.0)
Cholesterol, Total: 188 mg/dL (ref 100–199)
HDL: 63 mg/dL (ref >39)
LDL Calculated: 114 mg/dL — ABNORMAL HIGH (ref 0–99)
Triglycerides: 54 mg/dL (ref 0–149)
VLDL Cholesterol Cal: 11 mg/dL (ref 5–40)

## 2017-05-03 LAB — PSA: PROSTATE SPECIFIC AG, SERUM: 0.6 ng/mL (ref 0.0–4.0)

## 2017-07-07 ENCOUNTER — Other Ambulatory Visit: Payer: Self-pay | Admitting: Family Medicine

## 2017-09-16 ENCOUNTER — Other Ambulatory Visit: Payer: Self-pay | Admitting: Family Medicine

## 2017-11-02 DIAGNOSIS — H2513 Age-related nuclear cataract, bilateral: Secondary | ICD-10-CM | POA: Diagnosis not present

## 2018-01-04 ENCOUNTER — Encounter: Payer: Self-pay | Admitting: Family Medicine

## 2018-01-04 ENCOUNTER — Ambulatory Visit: Payer: BLUE CROSS/BLUE SHIELD | Admitting: Family Medicine

## 2018-01-04 VITALS — BP 118/70 | HR 65 | Temp 97.8°F | Wt 202.0 lb

## 2018-01-04 DIAGNOSIS — J22 Unspecified acute lower respiratory infection: Secondary | ICD-10-CM | POA: Diagnosis not present

## 2018-01-04 MED ORDER — AZITHROMYCIN 250 MG PO TABS: ORAL_TABLET | ORAL | 0 refills | Status: DC

## 2018-01-04 NOTE — Progress Notes (Signed)
BP 118/70 (BP Location: Left Arm, Patient Position: Sitting, Cuff Size: Normal)   Pulse 65   Temp 97.8 F (36.6 C) (Oral)   Wt 202 lb (91.6 kg)   SpO2 100%   BMI 28.98 kg/m    CC: URI Subjective:    Patient ID: Marc Bolton, male    DOB: [birth year], 62 y.o.   MRN: [id number]  HPI: Marc Bolton is a 62 y.o. male presenting on 01/04/2018 for URI (Was sick around 2/25 for upper respiratory sxs. Got better but not fully. Productive cough and chest congestion worsened about 3 wks ago. Tried Mucinex, very helpful. )   Had flu like illness last month - seen at [facility] treated with steroid course and cough suppressant with improvement. He did feel 90% better. Over last 3 weeks has had progressive productive cough with chest congestion. Not activity limiting. Mild head congestion, rhinorrhea.   No fevers/chills, ear or tooth pain, ST, PNDrainage or HA. No dyspnea or wheezing.   Tried mucinex which was helpful as well as dayquil/nyquil.  No sick contacts at home.  Non smoker No h/o asthma  Relevant past medical, surgical, family and social history reviewed and updated as indicated. Interim medical history since our last visit reviewed. Allergies and medications reviewed and updated. Outpatient Medications Prior to Visit  Medication Sig Dispense Refill  . aspirin 81 MG tablet Take 81 mg by mouth every Monday, Wednesday, and Friday.     . cholecalciferol (VITAMIN D) 1000 units tablet Take 1,000 Units by mouth daily.    . diclofenac sodium (VOLTAREN) 1 % GEL Apply 1 application topically 3 (three) times daily as needed (AAA). 1 Tube 1  . finasteride (PROSCAR) 5 MG tablet TAKE 1 TABLET BY MOUTH  DAILY 90 tablet 3  . Multiple Vitamin (MULTIVITAMIN) tablet Take 1 tablet by mouth daily.    . tamsulosin (FLOMAX) 0.4 MG CAPS capsule TAKE 1 CAPSULE BY MOUTH  DAILY 90 capsule 2   No facility-administered medications prior to visit.      Per HPI unless specifically indicated in ROS section  below Review of Systems     Objective:    BP 118/70 (BP Location: Left Arm, Patient Position: Sitting, Cuff Size: Normal)   Pulse 65   Temp 97.8 F (36.6 C) (Oral)   Wt 202 lb (91.6 kg)   SpO2 100%   BMI 28.98 kg/m   Wt Readings from Last 3 Encounters:  01/04/18 202 lb (91.6 kg)  05/02/17 208 lb (94.3 kg)  04/13/16 212 lb (96.2 kg)    Physical Exam  Constitutional: He appears well-developed and well-nourished. No distress.  HENT:  Head: Normocephalic and atraumatic.  Right Ear: Hearing, tympanic membrane, external ear and ear canal normal.  Left Ear: Hearing, tympanic membrane, external ear and ear canal normal.  Nose: Mucosal edema (L sided congestion/mucous) present. No rhinorrhea. Right sinus exhibits no maxillary sinus tenderness and no frontal sinus tenderness. Left sinus exhibits no maxillary sinus tenderness and no frontal sinus tenderness.  Mouth/Throat: Uvula is midline, oropharynx is clear and moist and mucous membranes are normal. No oropharyngeal exudate, posterior oropharyngeal edema, posterior oropharyngeal erythema or tonsillar abscesses.  Eyes: Pupils are equal, round, and reactive to light. Conjunctivae and EOM are normal. No scleral icterus.  Neck: Normal range of motion. Neck supple.  Cardiovascular: Normal rate, regular rhythm, normal heart sounds and intact distal pulses.  No murmur heard. Pulmonary/Chest: Effort normal and breath sounds normal. No respiratory distress. He has no wheezes.  He has no rales.  Lungs clear  Lymphadenopathy:    He has no cervical adenopathy.  Skin: Skin is warm and dry. No rash noted.  Nursing note and vitals reviewed.     Assessment & Plan:   Problem List Items Addressed This Visit    Acute respiratory infection - Primary    Recurrent respiratory infection after initial viral illness. Cover possible bacterial bronchitis with zpack antibiotic, reviewd supportive care. Pt declines Rx cough syrup. Update if not improving with  treatment.       Relevant Medications   azithromycin (ZITHROMAX) 250 MG tablet       Meds ordered this encounter  Medications  . azithromycin (ZITHROMAX) 250 MG tablet    Sig: Take two tablets on day one followed by one tablet on days 2-5    Dispense:  6 each    Refill:  0   No orders of the defined types were placed in this encounter.   Follow up plan: Return if symptoms worsen or fail to improve.  Ria Bush, MD

## 2018-01-04 NOTE — Patient Instructions (Signed)
I think you have bronchitis - treat with zpack antibiotic.  Push fluids and rest.  Take ibuprofen 400-600mg  with meals, nyquil for sleep.  Watch for fever >101, worsening productive cough or not improving with treatment.

## 2018-01-04 NOTE — Assessment & Plan Note (Signed)
Recurrent respiratory infection after initial viral illness. Cover possible bacterial bronchitis with zpack antibiotic, reviewd supportive care. Pt declines Rx cough syrup. Update if not improving with treatment.

## 2018-01-14 ENCOUNTER — Encounter: Payer: Self-pay | Admitting: Family Medicine

## 2018-01-16 MED ORDER — AZITHROMYCIN 250 MG PO TABS: ORAL_TABLET | ORAL | 0 refills | Status: DC

## 2018-01-16 MED ORDER — GUAIFENESIN-CODEINE 100-10 MG/5ML PO SYRP: 5.0000 mL | ORAL_SOLUTION | Freq: Two times a day (BID) | ORAL | 0 refills | Status: DC | PRN

## 2018-01-16 NOTE — Addendum Note (Signed)
Addended by: Ria Bush on: 01/16/2018 05:56 PM   Modules accepted: Orders

## 2018-01-16 NOTE — Telephone Encounter (Signed)
Pt would like to know if dr g is going to send in rx to the pharmacy. Please call [phone]

## 2018-02-08 ENCOUNTER — Encounter: Payer: Self-pay | Admitting: Family Medicine

## 2018-04-07 LAB — LIPID PANEL
CHOLESTEROL: 180 (ref 0–200)
HDL: 61 (ref 35–70)
LDL Cholesterol: 108
Triglycerides: 55 (ref 40–160)

## 2018-04-07 LAB — HEMOGLOBIN A1C: HEMOGLOBIN A1C: 5.1

## 2018-04-07 LAB — BASIC METABOLIC PANEL
Creatinine: 1 (ref 0.6–1.3)
GLUCOSE: 87

## 2018-04-17 ENCOUNTER — Encounter: Payer: 59 | Admitting: Family Medicine

## 2018-04-18 ENCOUNTER — Encounter: Payer: Self-pay | Admitting: Family Medicine

## 2018-05-08 ENCOUNTER — Encounter: Payer: BLUE CROSS/BLUE SHIELD | Admitting: Family Medicine

## 2018-05-15 ENCOUNTER — Encounter: Payer: Self-pay | Admitting: Family Medicine

## 2018-05-15 ENCOUNTER — Ambulatory Visit (INDEPENDENT_AMBULATORY_CARE_PROVIDER_SITE_OTHER): Payer: BLUE CROSS/BLUE SHIELD | Admitting: Family Medicine

## 2018-05-15 VITALS — BP 120/68 | HR 63 | Temp 98.3°F | Ht 70.0 in | Wt 200.2 lb

## 2018-05-15 DIAGNOSIS — N401 Enlarged prostate with lower urinary tract symptoms: Secondary | ICD-10-CM | POA: Diagnosis not present

## 2018-05-15 DIAGNOSIS — R3915 Urgency of urination: Secondary | ICD-10-CM | POA: Diagnosis not present

## 2018-05-15 DIAGNOSIS — E785 Hyperlipidemia, unspecified: Secondary | ICD-10-CM

## 2018-05-15 DIAGNOSIS — S76212A Strain of adductor muscle, fascia and tendon of left thigh, initial encounter: Secondary | ICD-10-CM

## 2018-05-15 DIAGNOSIS — Z0000 Encounter for general adult medical examination without abnormal findings: Secondary | ICD-10-CM

## 2018-05-15 DIAGNOSIS — S76219A Strain of adductor muscle, fascia and tendon of unspecified thigh, initial encounter: Secondary | ICD-10-CM | POA: Insufficient documentation

## 2018-05-15 DIAGNOSIS — N138 Other obstructive and reflux uropathy: Secondary | ICD-10-CM | POA: Diagnosis not present

## 2018-05-15 LAB — POC URINALSYSI DIPSTICK (AUTOMATED)
BILIRUBIN UA: NEGATIVE
GLUCOSE UA: NEGATIVE
Ketones, UA: NEGATIVE
Leukocytes, UA: NEGATIVE
Nitrite, UA: NEGATIVE
Protein, UA: NEGATIVE
RBC UA: NEGATIVE
Spec Grav, UA: 1.025 (ref 1.010–1.025)
Urobilinogen, UA: 0.2 E.U./dL
pH, UA: 6 (ref 5.0–8.0)

## 2018-05-15 MED ORDER — DOXAZOSIN MESYLATE 1 MG PO TABS: 1.0000 mg | ORAL_TABLET | Freq: Every day | ORAL | 6 refills | Status: DC

## 2018-05-15 NOTE — Addendum Note (Signed)
Addended by: Brenton Grills on: 7/0/3500 93:81 PM   Modules accepted: Orders

## 2018-05-15 NOTE — Progress Notes (Signed)
BP 120/68 (BP Location: Left Arm, Patient Position: Sitting, Cuff Size: Normal)   Pulse 63   Temp 98.3 F (36.8 C) (Oral)   Ht 5' 10"  (1.778 m)   Wt 200 lb 4 oz (90.8 kg)   SpO2 96%   BMI 28.73 kg/m    CC: CPE Subjective:    Patient ID: Marc Bolton, male    DOB: 11/10/1955, 62 y.o.   MRN: [id number]  HPI: Marc Bolton is a 62 y.o. male presenting on 05/15/2018 for Annual Exam (Wants to discuss Flomax.)   BPH - on flomax and finasteride. Has cut down on caffeine. Nocturia x2, no urinary accidents. Increased urinary frequency and urgency. Completely empties. No dysuria. Has started kegel exercises - occasional stress incontinence. Foot on floor, key in door phenomenon.   Notes some L groin discomfort after working out (eliptical, bicycle). Feels better with treadmill use.   Alcohol intake has increased recently.  Looking for house in [place], [place].   Preventative: COLONOSCOPY Date: 09/2015 TA, diverticulosis, rpt 3 yrs Ardis Hughs)  Prostate - gets checked yearly.  Flu yearly Tdap - 2013 Found to be MMR immune.  zostavax - 10/2013 shingrix - discussed Seat belt use discussed  Sunscreen use discussed. No changing moles.  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 Interim medical history since our last visit reviewed. Allergies and medications reviewed and updated. Outpatient Medications Prior to Visit  Medication Sig Dispense Refill  . aspirin 81 MG tablet Take 81 mg by mouth every Monday, Wednesday, and Friday.     . cholecalciferol (VITAMIN D) 1000 units tablet Take 1,000 Units by mouth daily.    . diclofenac sodium  (VOLTAREN) 1 % GEL Apply 1 application topically 3 (three) times daily as needed (AAA). 1 Tube 1  . finasteride (PROSCAR) 5 MG tablet TAKE 1 TABLET BY MOUTH  DAILY 90 tablet 3  . Multiple Vitamin (MULTIVITAMIN) tablet Take 1 tablet by mouth daily.    . tamsulosin (FLOMAX) 0.4 MG CAPS capsule TAKE 1 CAPSULE BY MOUTH  DAILY 90 capsule 2  . azithromycin (ZITHROMAX) 250 MG tablet Take two tablets on day one followed by one tablet on days 2-5 6 each 0  . guaiFENesin-codeine (CHERATUSSIN AC) 100-10 MG/5ML syrup Take 5 mLs by mouth 2 (two) times daily as needed. 140 mL 0   No facility-administered medications prior to visit.      Per HPI unless specifically indicated in ROS section below Review of Systems  Constitutional: Negative for activity change, appetite change, chills, fatigue, fever and unexpected weight change.  HENT: Negative for hearing loss.   Eyes: Negative for visual disturbance.  Respiratory: Negative for cough, chest tightness, shortness of breath and wheezing.   Cardiovascular: Negative for chest pain, palpitations and leg swelling.  Gastrointestinal: Negative for abdominal distention, abdominal pain, blood in stool, constipation, diarrhea, nausea and vomiting.  Genitourinary: Negative for difficulty urinating and hematuria.  Musculoskeletal: Negative for arthralgias, myalgias and neck pain.  Skin: Negative for rash.  Neurological: Negative for dizziness, seizures, syncope and headaches.  Hematological: Negative for adenopathy. Does not bruise/bleed easily.  Psychiatric/Behavioral: Negative for dysphoric mood. The patient is not nervous/anxious.  Objective:    BP 120/68 (BP Location: Left Arm, Patient Position: Sitting, Cuff Size: Normal)   Pulse 63   Temp 98.3 F (36.8 C) (Oral)   Ht 5' 10"  (1.778 m)   Wt 200 lb 4 oz (90.8 kg)   SpO2 96%   BMI 28.73 kg/m   Wt Readings from Last 3 Encounters:  05/15/18 200 lb 4 oz (90.8 kg)  01/04/18 202 lb (91.6 kg)  05/02/17  208 lb (94.3 kg)    Physical Exam  Constitutional: He is oriented to person, place, and time. He appears well-developed and well-nourished. No distress.  HENT:  Head: Normocephalic and atraumatic.  Right Ear: Hearing, tympanic membrane, external ear and ear canal normal.  Left Ear: Hearing, tympanic membrane, external ear and ear canal normal.  Nose: Nose normal.  Mouth/Throat: Uvula is midline, oropharynx is clear and moist and mucous membranes are normal. No oropharyngeal exudate, posterior oropharyngeal edema or posterior oropharyngeal erythema.  Eyes: Pupils are equal, round, and reactive to light. Conjunctivae and EOM are normal. No scleral icterus.  Neck: Normal range of motion. Neck supple. No thyromegaly present.  Cardiovascular: Normal rate, regular rhythm, normal heart sounds and intact distal pulses.  No murmur heard. Pulses:      Radial pulses are 2+ on the right side, and 2+ on the left side.  Pulmonary/Chest: Effort normal and breath sounds normal. No respiratory distress. He has no wheezes. He has no rales.  Abdominal: Soft. Bowel sounds are normal. He exhibits no distension and no mass. There is no tenderness. There is no rebound and no guarding.  Genitourinary: Rectum normal and prostate normal. Rectal exam shows no external hemorrhoid, no internal hemorrhoid, no fissure, no mass, no tenderness and anal tone normal. Prostate is not enlarged (25gm) and not tender.  Musculoskeletal: Normal range of motion. He exhibits no edema.  Neg SLR bilaterally. No pain with int/ext rotation at hip. Some discomfort with palpation at L GTB Discomfort with testing of hip flexors and abductors against resistance.  Lymphadenopathy:    He has no cervical adenopathy.  Neurological: He is alert and oriented to person, place, and time.  CN grossly intact, station and gait intact  Skin: Skin is warm and dry. No rash noted.  Psychiatric: He has a normal mood and affect. His behavior is normal.  Judgment and thought content normal.  Nursing note and vitals reviewed.  Results for orders placed or performed in visit on 05/02/17  Lipid panel  Result Value Ref Range   Cholesterol, Total 188 100 - 199 mg/dL   Triglycerides 54 0 - 149 mg/dL   HDL 63 >39 mg/dL   VLDL Cholesterol Cal 11 5 - 40 mg/dL   LDL Calculated 114 (H) 0 - 99 mg/dL   Chol/HDL Ratio 3.0 0.0 - 5.0 ratio  PSA  Result Value Ref Range   Prostate Specific Ag, Serum 0.6 0.0 - 4.0 ng/mL  Basic metabolic panel  Result Value Ref Range   Glucose 91 65 - 99 mg/dL   BUN 13 8 - 27 mg/dL   Creatinine, Ser 0.97 0.76 - 1.27 mg/dL   GFR calc non Af Amer 84 >59 mL/min/1.73   GFR calc Af Amer 98 >59 mL/min/1.73   BUN/Creatinine Ratio 13 10 - 24   Sodium 139 134 - 144 mmol/L   Potassium 4.0 3.5 - 5.2 mmol/L   Chloride 101 96 - 106 mmol/L   CO2 24 20 - 29 mmol/L   Calcium 9.0 8.6 - 10.2  mg/dL      Assessment & Plan:   Problem List Items Addressed This Visit    Urinary urgency    ?BPH related vs other - he does have some urinary urgency and stress incontinence symptoms. Discussed possible urology referral for further evaluation - will start with trial cardura in place of flomax.      HLD (hyperlipidemia)    Chronic, stable off meds. Due for FLP - I asked him to bring me recent labs from [place].  The 10-year ASCVD risk score Mikey Bussing DC Brooke Bonito., et al., 2013) is: 6.8%   Values used to calculate the score:     Age: 13 years     Sex: Male     Is Non-Hispanic African American: No     Diabetic: No     Tobacco smoker: No     Systolic Blood Pressure: 063 mmHg     Is BP treated: No     HDL Cholesterol: 63 mg/dL     Total Cholesterol: 188 mg/dL       Relevant Medications   doxazosin (CARDURA) 1 MG tablet   Healthcare maintenance - Primary    Preventative protocols reviewed and updated unless pt declined. Discussed healthy diet and lifestyle.       Groin strain    Handout provided for groin strain exercises      BPH  with obstruction/lower urinary tract symptoms    Ongoing. Desires trial of other med in place of flomax - will trial cardura. DRE stable.  RTC 1 mo f/u visit.  Will request recent labs for recent PSA.  I-PSS 7/3 --> 11/2 (mainly with worse urinary urgency).           Meds ordered this encounter  Medications  . doxazosin (CARDURA) 1 MG tablet    Sig: Take 1 tablet (1 mg total) by mouth daily.    Dispense:  30 tablet    Refill:  6   No orders of the defined types were placed in this encounter.   Follow up plan: Return in about 1 month (around 06/12/2018) for annual exam, prior fasting for blood work.  Ria Bush, MD

## 2018-05-15 NOTE — Assessment & Plan Note (Signed)
Handout provided for groin strain exercises

## 2018-05-15 NOTE — Patient Instructions (Addendum)
You are doing well today.  Urinalysis today.  If interested, check with pharmacy about new 2 shot shingles series (shingrix).  Bring me copy of labwork through [facility].  Trial cardura in place of flomax. Take 1 tablet daily for 1 week, if ongoing symptoms increase to 2 tablets daily. Update me with effect after several weeks. Return in 1 month for follow up visit.   Health Maintenance, Male A healthy lifestyle and preventive care is important for your health and wellness. Ask your health care provider about what schedule of regular examinations is right for you. What should I know about weight and diet? Eat a Healthy Diet  Eat plenty of vegetables, fruits, whole grains, low-fat dairy products, and lean protein.  Do not eat a lot of foods high in solid fats, added sugars, or salt.  Maintain a Healthy Weight Regular exercise can help you achieve or maintain a healthy weight. You should:  Do at least 150 minutes of exercise each week. The exercise should increase your heart rate and make you sweat (moderate-intensity exercise).  Do strength-training exercises at least twice a week.  Watch Your Levels of Cholesterol and Blood Lipids  Have your blood tested for lipids and cholesterol every 5 years starting at 62 years of age. If you are at high risk for heart disease, you should start having your blood tested when you are 62 years old. You may need to have your cholesterol levels checked more often if: ? Your lipid or cholesterol levels are high. ? You are older than 62 years of age. ? You are at high risk for heart disease.  What should I know about cancer screening? Many types of cancers can be detected early and may often be prevented. Lung Cancer  You should be screened every year for lung cancer if: ? You are a current smoker who has smoked for at least 30 years. ? You are a former smoker who has quit within the past 15 years.  Talk to your health care provider about your  screening options, when you should start screening, and how often you should be screened.  Colorectal Cancer  Routine colorectal cancer screening usually begins at 62 years of age and should be repeated every 5-10 years until you are 62 years old. You may need to be screened more often if early forms of precancerous polyps or small growths are found. Your health care provider may recommend screening at an earlier age if you have risk factors for colon cancer.  Your health care provider may recommend using home test kits to check for hidden blood in the stool.  A small camera at the end of a tube can be used to examine your colon (sigmoidoscopy or colonoscopy). This checks for the earliest forms of colorectal cancer.  Prostate and Testicular Cancer  Depending on your age and overall health, your health care provider may do certain tests to screen for prostate and testicular cancer.  Talk to your health care provider about any symptoms or concerns you have about testicular or prostate cancer.  Skin Cancer  Check your skin from head to toe regularly.  Tell your health care provider about any new moles or changes in moles, especially if: ? There is a change in a mole's size, shape, or color. ? You have a mole that is larger than a pencil eraser.  Always use sunscreen. Apply sunscreen liberally and repeat throughout the day.  Protect yourself by wearing long sleeves, pants, a wide-brimmed hat,  and sunglasses when outside.  What should I know about heart disease, diabetes, and high blood pressure?  If you are 63-51 years of age, have your blood pressure checked every 3-5 years. If you are 66 years of age or older, have your blood pressure checked every year. You should have your blood pressure measured twice-once when you are at a hospital or clinic, and once when you are not at a hospital or clinic. Record the average of the two measurements. To check your blood pressure when you are not at  a hospital or clinic, you can use: ? An automated blood pressure machine at a pharmacy. ? A home blood pressure monitor.  Talk to your health care provider about your target blood pressure.  If you are between 25-68 years old, ask your health care provider if you should take aspirin to prevent heart disease.  Have regular diabetes screenings by checking your fasting blood sugar level. ? If you are at a normal weight and have a low risk for diabetes, have this test once every three years after the age of 12. ? If you are overweight and have a high risk for diabetes, consider being tested at a younger age or more often.  A one-time screening for abdominal aortic aneurysm (AAA) by ultrasound is recommended for men aged 82-75 years who are current or former smokers. What should I know about preventing infection? Hepatitis B If you have a higher risk for hepatitis B, you should be screened for this virus. Talk with your health care provider to find out if you are at risk for hepatitis B infection. Hepatitis C Blood testing is recommended for:  Everyone born from [birth year] through 1965.  Anyone with known risk factors for hepatitis C.  Sexually Transmitted Diseases (STDs)  You should be screened each year for STDs including gonorrhea and chlamydia if: ? You are sexually active and are younger than 62 years of age. ? You are older than 62 years of age and your health care provider tells you that you are at risk for this type of infection. ? Your sexual activity has changed since you were last screened and you are at an increased risk for chlamydia or gonorrhea. Ask your health care provider if you are at risk.  Talk with your health care provider about whether you are at high risk of being infected with HIV. Your health care provider may recommend a prescription medicine to help prevent HIV infection.  What else can I do?  Schedule regular health, dental, and eye exams.  Stay current with  your vaccines (immunizations).  Do not use any tobacco products, such as cigarettes, chewing tobacco, and e-cigarettes. If you need help quitting, ask your health care provider.  Limit alcohol intake to no more than 2 drinks per day. One drink equals 12 ounces of beer, 5 ounces of wine, or 1 ounces of hard liquor.  Do not use street drugs.  Do not share needles.  Ask your health care provider for help if you need support or information about quitting drugs.  Tell your health care provider if you often feel depressed.  Tell your health care provider if you have ever been abused or do not feel safe at home. This information is not intended to replace advice given to you by your health care provider. Make sure you discuss any questions you have with your health care provider. Document Released: 03/24/2008 Document Revised: 05/25/2016 Document Reviewed: 06/30/2015 Elsevier Interactive Patient Education  2018 [employer]  

## 2018-05-15 NOTE — Assessment & Plan Note (Signed)
Preventative protocols reviewed and updated unless pt declined. Discussed healthy diet and lifestyle.  

## 2018-05-15 NOTE — Assessment & Plan Note (Addendum)
Ongoing. Desires trial of other med in place of flomax - will trial cardura. DRE stable.  RTC 1 mo f/u visit.  Will request recent labs for recent PSA.  I-PSS 7/3 --> 11/2 (mainly with worse urinary urgency).

## 2018-05-15 NOTE — Assessment & Plan Note (Signed)
?  BPH related vs other - he does have some urinary urgency and stress incontinence symptoms. Discussed possible urology referral for further evaluation - will start with trial cardura in place of flomax.

## 2018-05-15 NOTE — Assessment & Plan Note (Addendum)
Chronic, stable off meds. Due for FLP - I asked him to bring me recent labs from [place].  The 10-year ASCVD risk score Mikey Bussing DC Brooke Bonito., et al., 2013) is: 6.8%   Values used to calculate the score:     Age: 62 years     Sex: Male     Is Non-Hispanic African American: No     Diabetic: No     Tobacco smoker: No     Systolic Blood Pressure: 884 mmHg     Is BP treated: No     HDL Cholesterol: 63 mg/dL     Total Cholesterol: 188 mg/dL

## 2018-05-16 ENCOUNTER — Telehealth: Payer: Self-pay | Admitting: Family Medicine

## 2018-05-16 ENCOUNTER — Encounter: Payer: Self-pay | Admitting: Family Medicine

## 2018-05-16 NOTE — Telephone Encounter (Addendum)
Spoke with pt relaying Dr. Synthia Innocent message. Pt verbalizes understanding.  Labs entered and placed to be scanned.

## 2018-05-16 NOTE — Telephone Encounter (Signed)
plz notify I did review labs he brought in. Thanks.  All looked good. PSA was not checked - I recommend we check this next year.  placed in Lisa's box for scanning.

## 2018-05-18 ENCOUNTER — Encounter: Payer: Self-pay | Admitting: Family Medicine

## 2018-05-19 ENCOUNTER — Other Ambulatory Visit: Payer: Self-pay | Admitting: Family Medicine

## 2018-06-10 ENCOUNTER — Other Ambulatory Visit: Payer: Self-pay | Admitting: Family Medicine

## 2018-06-15 ENCOUNTER — Encounter: Payer: Self-pay | Admitting: Family Medicine

## 2018-06-15 ENCOUNTER — Ambulatory Visit: Payer: BLUE CROSS/BLUE SHIELD | Admitting: Family Medicine

## 2018-06-15 VITALS — BP 118/70 | HR 64 | Temp 97.9°F | Ht 70.0 in | Wt 205.8 lb

## 2018-06-15 DIAGNOSIS — N401 Enlarged prostate with lower urinary tract symptoms: Secondary | ICD-10-CM | POA: Diagnosis not present

## 2018-06-15 DIAGNOSIS — N138 Other obstructive and reflux uropathy: Secondary | ICD-10-CM | POA: Diagnosis not present

## 2018-06-15 DIAGNOSIS — Z23 Encounter for immunization: Secondary | ICD-10-CM

## 2018-06-15 MED ORDER — DOXAZOSIN MESYLATE 1 MG PO TABS: 1.0000 mg | ORAL_TABLET | Freq: Every day | ORAL | 3 refills | Status: DC

## 2018-06-15 NOTE — Patient Instructions (Addendum)
Flu shot today PSA today.  I'm glad you're doing better - continue cardura and finasteride.

## 2018-06-15 NOTE — Assessment & Plan Note (Signed)
Significant improvement with cardura in place of flomax - pt satisfied. Continue current regimen. 90d supply sent in. Continue this as well as finasteride. Continue to avoid bladder irritants.  I-PSS 7/3 --> 11/2 --> 6/2

## 2018-06-15 NOTE — Progress Notes (Signed)
BP 118/70 (BP Location: Left Arm, Patient Position: Sitting, Cuff Size: Normal)   Pulse 64   Temp 97.9 F (36.6 C) (Oral)   Ht 5\' 10"  (1.778 m)   Wt 205 lb 12 oz (93.3 kg)   SpO2 94%   BMI 29.52 kg/m    CC: 1 mo f/u visit Subjective:    Patient ID: Marc Bolton, male    DOB: 03-27-1956, 62 y.o.   MRN: [id number]  HPI: Marc Bolton is a 62 y.o. male presenting on 06/15/2018 for 1 mo follow up   See prior note for details. Worsening BPH despite flomax and finasteride - namely increased urinary urgency with both urge and stress incontinence symptoms. Last visit we changed flomax to cardura and I asked him to return in 1 mo for f/u. He feels this has significantly improved - both stress and urge symptoms. He has also tried to limit caffeinated beverages. Nocturia x1 at night. Strong stream. No dizziness on cardura.   UA last visit was normal. DRE was reassuring. Due for PSA.   Relevant past medical, surgical, family and social history reviewed and updated as indicated. Interim medical history since our last visit reviewed. Allergies and medications reviewed and updated. Outpatient Medications Prior to Visit  Medication Sig Dispense Refill  . aspirin 81 MG tablet Take 81 mg by mouth every Monday, Wednesday, and Friday.     . cholecalciferol (VITAMIN D) 1000 units tablet Take 1,000 Units by mouth daily.    . diclofenac sodium (VOLTAREN) 1 % GEL Apply 1 application topically 3 (three) times daily as needed (AAA). 1 Tube 1  . finasteride (PROSCAR) 5 MG tablet TAKE 1 TABLET BY MOUTH  DAILY 90 tablet 3  . Multiple Vitamin (MULTIVITAMIN) tablet Take 1 tablet by mouth daily.    Marland Kitchen doxazosin (CARDURA) 1 MG tablet Take 1 tablet (1 mg total) by mouth daily. 30 tablet 6  . tamsulosin (FLOMAX) 0.4 MG CAPS capsule TAKE 1 CAPSULE BY MOUTH  DAILY 90 capsule 0   No facility-administered medications prior to visit.      Per HPI unless specifically indicated in ROS section below Review of Systems    Objective:    BP 118/70 (BP Location: Left Arm, Patient Position: Sitting, Cuff Size: Normal)   Pulse 64   Temp 97.9 F (36.6 C) (Oral)   Ht 5\' 10"  (1.778 m)   Wt 205 lb 12 oz (93.3 kg)   SpO2 94%   BMI 29.52 kg/m   Wt Readings from Last 3 Encounters:  06/15/18 205 lb 12 oz (93.3 kg)  05/15/18 200 lb 4 oz (90.8 kg)  01/04/18 202 lb (91.6 kg)    Physical Exam  Constitutional: He appears well-developed and well-nourished. No distress.  Nursing note and vitals reviewed.  Results for orders placed or performed in visit on 09/32/67  Basic metabolic panel  Result Value Ref Range   Glucose 87    Creatinine 1.0 0.6 - 1.3  Lipid panel  Result Value Ref Range   Triglycerides 55 40 - 160   Cholesterol 180 0 - 200   HDL 61 35 - 70   LDL Cholesterol 108   Hemoglobin A1c  Result Value Ref Range   Hemoglobin A1C 5.1       Assessment & Plan:   Problem List Items Addressed This Visit    BPH with obstruction/lower urinary tract symptoms - Primary    Significant improvement with cardura in place of flomax - pt satisfied. Continue current  regimen. 90d supply sent in. Continue this as well as finasteride. Continue to avoid bladder irritants.  I-PSS 7/3 --> 11/2 --> 6/2      Relevant Orders   PSA    Other Visit Diagnoses    Need for influenza vaccination       Relevant Orders   Flu Vaccine QUAD 36+ mos IM (Completed)       Meds ordered this encounter  Medications  . doxazosin (CARDURA) 1 MG tablet    Sig: Take 1 tablet (1 mg total) by mouth daily.    Dispense:  90 tablet    Refill:  3   Orders Placed This Encounter  Procedures  . Flu Vaccine QUAD 36+ mos IM  . PSA    Follow up plan: Return if symptoms worsen or fail to improve.  Ria Bush, MD

## 2018-06-16 LAB — PSA: Prostate Specific Ag, Serum: 0.4 ng/mL (ref 0.0–4.0)

## 2018-07-12 ENCOUNTER — Other Ambulatory Visit: Payer: Self-pay

## 2018-07-12 MED ORDER — DOXAZOSIN MESYLATE 1 MG PO TABS: 1.0000 mg | ORAL_TABLET | Freq: Every day | ORAL | 3 refills | Status: DC

## 2018-07-12 NOTE — Telephone Encounter (Signed)
Escribed

## 2018-07-15 ENCOUNTER — Other Ambulatory Visit: Payer: Self-pay | Admitting: Family Medicine

## 2018-07-25 ENCOUNTER — Encounter: Payer: Self-pay | Admitting: Gastroenterology

## 2018-07-31 ENCOUNTER — Other Ambulatory Visit: Payer: Self-pay | Admitting: Family Medicine

## 2018-08-31 ENCOUNTER — Encounter: Payer: BLUE CROSS/BLUE SHIELD | Admitting: Gastroenterology

## 2018-09-05 ENCOUNTER — Encounter: Payer: BLUE CROSS/BLUE SHIELD | Admitting: Gastroenterology

## 2018-09-27 ENCOUNTER — Encounter: Payer: Self-pay | Admitting: Family Medicine

## 2018-10-01 MED ORDER — DOXAZOSIN MESYLATE 1 MG PO TABS: 1.0000 mg | ORAL_TABLET | Freq: Every day | ORAL | 3 refills | Status: DC

## 2018-10-08 ENCOUNTER — Ambulatory Visit (AMBULATORY_SURGERY_CENTER): Payer: Self-pay

## 2018-10-08 ENCOUNTER — Encounter: Payer: Self-pay | Admitting: Gastroenterology

## 2018-10-08 VITALS — Ht 70.0 in | Wt 205.0 lb

## 2018-10-08 DIAGNOSIS — Z86010 Personal history of colonic polyps: Secondary | ICD-10-CM

## 2018-10-08 MED ORDER — PEG 3350-KCL-NA BICARB-NACL 420 G PO SOLR: 4000.0000 mL | Freq: Once | ORAL | 0 refills | Status: AC

## 2018-10-08 NOTE — Progress Notes (Signed)
No egg or soy allergy known to patient  No issues with past sedation with any surgeries  or procedures, no intubation problems  No diet pills per patient No home 02 use per patient  No blood thinners per patient  Pt denies issues with constipation  No A fib or A flutter  EMMI video sent to pt's e mail. Declined

## 2018-10-10 HISTORY — PX: COLONOSCOPY: SHX174

## 2018-10-10 HISTORY — PX: CATARACT EXTRACTION: SUR2

## 2018-10-17 ENCOUNTER — Ambulatory Visit (AMBULATORY_SURGERY_CENTER): Payer: BLUE CROSS/BLUE SHIELD | Admitting: Gastroenterology

## 2018-10-17 ENCOUNTER — Encounter: Payer: Self-pay | Admitting: Gastroenterology

## 2018-10-17 VITALS — BP 96/47 | HR 67 | Temp 98.4°F | Resp 18 | Ht 70.0 in | Wt 205.0 lb

## 2018-10-17 DIAGNOSIS — K649 Unspecified hemorrhoids: Secondary | ICD-10-CM

## 2018-10-17 DIAGNOSIS — D122 Benign neoplasm of ascending colon: Secondary | ICD-10-CM

## 2018-10-17 DIAGNOSIS — Z86010 Personal history of colonic polyps: Secondary | ICD-10-CM

## 2018-10-17 DIAGNOSIS — D124 Benign neoplasm of descending colon: Secondary | ICD-10-CM

## 2018-10-17 DIAGNOSIS — Z1211 Encounter for screening for malignant neoplasm of colon: Secondary | ICD-10-CM | POA: Diagnosis not present

## 2018-10-17 MED ORDER — SODIUM CHLORIDE 0.9 % IV SOLN: 500.0000 mL | Freq: Once | INTRAVENOUS | Status: DC

## 2018-10-17 NOTE — Progress Notes (Signed)
Called to room to assist during endoscopic procedure.  Patient ID and intended procedure confirmed with present staff. Received instructions for my participation in the procedure from the performing physician.  

## 2018-10-17 NOTE — Progress Notes (Signed)
Pt's states no medical or surgical changes since previsit or office visit. 

## 2018-10-17 NOTE — Patient Instructions (Signed)
Handouts given on polyps and hemorrhoids   YOU HAD AN ENDOSCOPIC PROCEDURE TODAY AT THE [facility]:   Refer to the procedure report that was given to you for any specific questions about what was found during the examination.  If the procedure report does not answer your questions, please call your gastroenterologist to clarify.  If you requested that your care partner not be given the details of your procedure findings, then the procedure report has been included in a sealed envelope for you to review at your convenience later.  YOU SHOULD EXPECT: Some feelings of bloating in the abdomen. Passage of more gas than usual.  Walking can help get rid of the air that was put into your GI tract during the procedure and reduce the bloating. If you had a lower endoscopy (such as a colonoscopy or flexible sigmoidoscopy) you may notice spotting of blood in your stool or on the toilet paper. If you underwent a bowel prep for your procedure, you may not have a normal bowel movement for a few days.  Please Note:  You might notice some irritation and congestion in your nose or some drainage.  This is from the oxygen used during your procedure.  There is no need for concern and it should clear up in a day or so.  SYMPTOMS TO REPORT IMMEDIATELY:   Following lower endoscopy (colonoscopy or flexible sigmoidoscopy):  Excessive amounts of blood in the stool  Significant tenderness or worsening of abdominal pains  Swelling of the abdomen that is new, acute  Fever of 100F or higher    For urgent or emergent issues, a gastroenterologist can be reached at any hour by calling [phone].   DIET:  We do recommend a small meal at first, but then you may proceed to your regular diet.  Drink plenty of fluids but you should avoid alcoholic beverages for 24 hours.  ACTIVITY:  You should plan to take it easy for the rest of today and you should NOT DRIVE or use heavy machinery until tomorrow (because of  the sedation medicines used during the test).    FOLLOW UP: Our staff will call the number listed on your records the next business day following your procedure to check on you and address any questions or concerns that you may have regarding the information given to you following your procedure. If we do not reach you, we will leave a message.  However, if you are feeling well and you are not experiencing any problems, there is no need to return our call.  We will assume that you have returned to your regular daily activities without incident.  If any biopsies were taken you will be contacted by phone or by letter within the next 1-3 weeks.  Please call us at [phone] if you have not heard about the biopsies in 3 weeks.    SIGNATURES/CONFIDENTIALITY: You and/or your care partner have signed paperwork which will be entered into your electronic medical record.  These signatures attest to the fact that that the information above on your After Visit Summary has been reviewed and is understood.  Full responsibility of the confidentiality of this discharge information lies with you and/or your care-partner. 

## 2018-10-17 NOTE — Op Note (Signed)
[name] Procedure Date: 10/17/2018 10:30 AM MRN: [id number] Endoscopist: Milus Banister , MD Age: 63 Referring MD:  Date of Birth: [birth year] Gender: Male Account #: [id number] Procedure:                Colonoscopy Indications:              High risk colon cancer surveillance: Personal                            history of colonic polyps; colonoscopy 2013 three                            subCM adenomas; colonoscopy 2016 5 subCM adenomas Medicines:                Monitored Anesthesia Care Procedure:                Pre-Anesthesia Assessment:                           - Prior to the procedure, a History and Physical                            was performed, and patient medications and                            allergies were reviewed. The patient's tolerance of                            previous anesthesia was also reviewed. The risks                            and benefits of the procedure and the sedation                            options and risks were discussed with the patient.                            All questions were answered, and informed consent                            was obtained. Prior Anticoagulants: The patient has                            taken no previous anticoagulant or antiplatelet                            agents. ASA Grade Assessment: II - A patient with                            mild systemic disease. After reviewing the risks                            and benefits, the patient was deemed in  satisfactory condition to undergo the procedure.                           After obtaining informed consent, the colonoscope                            was passed under direct vision. Throughout the                            procedure, the patient's blood pressure, pulse, and                            oxygen saturations were monitored continuously. The                            Colonoscope was  introduced through the anus and                            advanced to the the cecum, identified by                            appendiceal orifice and ileocecal valve. The                            colonoscopy was performed without difficulty. The                            patient tolerated the procedure well. The quality                            of the bowel preparation was good. The ileocecal                            valve, appendiceal orifice, and rectum were                            photographed. Scope In: 10:38:51 AM Scope Out: 10:52:33 AM Scope Withdrawal Time: 0 hours 10 minutes 13 seconds  Total Procedure Duration: 0 hours 13 minutes 42 seconds  Findings:                 Four sessile polyps were found in the descending                            colon and ascending colon. The polyps were 3 to 5                            mm in size. These polyps were removed with a cold                            snare. Resection and retrieval were complete.                           Internal hemorrhoids were found. The hemorrhoids  were small.                           The exam was otherwise without abnormality on                            direct and retroflexion views. Complications:            No immediate complications. Estimated blood loss:                            None. Estimated Blood Loss:     Estimated blood loss: none. Impression:               - Four 3 to 5 mm polyps in the descending colon and                            in the ascending colon, removed with a cold snare.                            Resected and retrieved.                           - Internal hemorrhoids.                           - The examination was otherwise normal on direct                            and retroflexion views. Recommendation:           - Patient has a contact number available for                            emergencies. The signs and symptoms of potential                             delayed complications were discussed with the                            patient. Return to normal activities tomorrow.                            Written discharge instructions were provided to the                            patient.                           - Resume previous diet.                           - Continue present medications.                           You will receive a letter within 2-3 weeks with the  pathology results and my final recommendations.                           If the polyp(s) is proven to be 'pre-cancerous' on                            pathology, you will need repeat colonoscopy in 3-5                            years. Milus Banister, MD 10/17/2018 10:55:08 AM This report has been signed electronically.

## 2018-10-17 NOTE — Progress Notes (Signed)
PT taken to PACU. Monitors in place. VSS. Report given to RN. 

## 2018-10-18 ENCOUNTER — Telehealth: Payer: Self-pay

## 2018-10-18 NOTE — Telephone Encounter (Signed)
  Follow up Call-  Call back number 10/17/2018  Post procedure Call Back phone  # [phone]  Permission to leave phone message Yes  Some recent data might be hidden     Patient questions:  Do you have a fever, pain , or abdominal swelling? No. Pain Score  0 *  Have you tolerated food without any problems? Yes.    Have you been able to return to your normal activities? Yes.    Do you have any questions about your discharge instructions: Diet   No. Medications  No. Follow up visit  No.  Do you have questions or concerns about your Care? No.  Actions: * If pain score is 4 or above: No action needed, pain <4.

## 2018-10-25 ENCOUNTER — Encounter: Payer: Self-pay | Admitting: Gastroenterology

## 2018-11-15 DIAGNOSIS — H25813 Combined forms of age-related cataract, bilateral: Secondary | ICD-10-CM | POA: Diagnosis not present

## 2018-11-15 DIAGNOSIS — H353131 Nonexudative age-related macular degeneration, bilateral, early dry stage: Secondary | ICD-10-CM | POA: Diagnosis not present

## 2018-12-01 ENCOUNTER — Encounter: Payer: Self-pay | Admitting: Family Medicine

## 2018-12-03 DIAGNOSIS — H2512 Age-related nuclear cataract, left eye: Secondary | ICD-10-CM | POA: Diagnosis not present

## 2018-12-10 DIAGNOSIS — H2512 Age-related nuclear cataract, left eye: Secondary | ICD-10-CM | POA: Diagnosis not present

## 2018-12-10 DIAGNOSIS — H25812 Combined forms of age-related cataract, left eye: Secondary | ICD-10-CM | POA: Diagnosis not present

## 2018-12-11 DIAGNOSIS — H2511 Age-related nuclear cataract, right eye: Secondary | ICD-10-CM | POA: Diagnosis not present

## 2018-12-17 DIAGNOSIS — H2511 Age-related nuclear cataract, right eye: Secondary | ICD-10-CM | POA: Diagnosis not present

## 2018-12-17 DIAGNOSIS — H25811 Combined forms of age-related cataract, right eye: Secondary | ICD-10-CM | POA: Diagnosis not present

## 2018-12-27 DIAGNOSIS — Z961 Presence of intraocular lens: Secondary | ICD-10-CM | POA: Diagnosis not present

## 2019-04-18 DIAGNOSIS — H26492 Other secondary cataract, left eye: Secondary | ICD-10-CM | POA: Diagnosis not present

## 2019-05-19 ENCOUNTER — Other Ambulatory Visit: Payer: Self-pay | Admitting: Family Medicine

## 2019-05-19 DIAGNOSIS — N138 Other obstructive and reflux uropathy: Secondary | ICD-10-CM

## 2019-05-19 DIAGNOSIS — E785 Hyperlipidemia, unspecified: Secondary | ICD-10-CM

## 2019-05-19 DIAGNOSIS — N401 Enlarged prostate with lower urinary tract symptoms: Secondary | ICD-10-CM

## 2019-05-20 ENCOUNTER — Other Ambulatory Visit: Payer: Self-pay

## 2019-05-20 ENCOUNTER — Other Ambulatory Visit (INDEPENDENT_AMBULATORY_CARE_PROVIDER_SITE_OTHER): Payer: BC Managed Care – PPO

## 2019-05-20 DIAGNOSIS — E785 Hyperlipidemia, unspecified: Secondary | ICD-10-CM

## 2019-05-20 DIAGNOSIS — N401 Enlarged prostate with lower urinary tract symptoms: Secondary | ICD-10-CM | POA: Diagnosis not present

## 2019-05-20 DIAGNOSIS — N138 Other obstructive and reflux uropathy: Secondary | ICD-10-CM

## 2019-05-20 LAB — PSA: PSA: 0.55 ng/mL (ref 0.10–4.00)

## 2019-05-20 LAB — COMPREHENSIVE METABOLIC PANEL
ALT: 16 U/L (ref 0–53)
AST: 17 U/L (ref 0–37)
Albumin: 3.9 g/dL (ref 3.5–5.2)
Alkaline Phosphatase: 62 U/L (ref 39–117)
BUN: 17 mg/dL (ref 6–23)
CO2: 29 mEq/L (ref 19–32)
Calcium: 9.2 mg/dL (ref 8.4–10.5)
Chloride: 105 mEq/L (ref 96–112)
Creatinine, Ser: 1 mg/dL (ref 0.40–1.50)
GFR: 75.52 mL/min (ref >60.00)
Glucose, Bld: 91 mg/dL (ref 70–99)
Potassium: 4 mEq/L (ref 3.5–5.1)
Sodium: 142 mEq/L (ref 135–145)
Total Bilirubin: 0.6 mg/dL (ref 0.2–1.2)
Total Protein: 6.2 g/dL (ref 6.0–8.3)

## 2019-05-20 LAB — LIPID PANEL
Cholesterol: 169 mg/dL (ref 0–200)
HDL: 50.7 mg/dL (ref >39.00)
LDL Cholesterol: 104 mg/dL — ABNORMAL HIGH (ref 0–99)
NonHDL: 118.31
Total CHOL/HDL Ratio: 3
Triglycerides: 70 mg/dL (ref 0.0–149.0)
VLDL: 14 mg/dL (ref 0.0–40.0)

## 2019-05-23 ENCOUNTER — Other Ambulatory Visit: Payer: Self-pay

## 2019-05-23 ENCOUNTER — Ambulatory Visit (INDEPENDENT_AMBULATORY_CARE_PROVIDER_SITE_OTHER): Payer: BC Managed Care – PPO | Admitting: Family Medicine

## 2019-05-23 ENCOUNTER — Encounter: Payer: Self-pay | Admitting: Family Medicine

## 2019-05-23 VITALS — BP 112/64 | HR 66 | Temp 97.7°F | Ht 70.0 in | Wt 201.5 lb

## 2019-05-23 DIAGNOSIS — D1720 Benign lipomatous neoplasm of skin and subcutaneous tissue of unspecified limb: Secondary | ICD-10-CM

## 2019-05-23 DIAGNOSIS — E785 Hyperlipidemia, unspecified: Secondary | ICD-10-CM

## 2019-05-23 DIAGNOSIS — N401 Enlarged prostate with lower urinary tract symptoms: Secondary | ICD-10-CM | POA: Diagnosis not present

## 2019-05-23 DIAGNOSIS — Z0000 Encounter for general adult medical examination without abnormal findings: Secondary | ICD-10-CM

## 2019-05-23 DIAGNOSIS — N138 Other obstructive and reflux uropathy: Secondary | ICD-10-CM

## 2019-05-23 DIAGNOSIS — D1779 Benign lipomatous neoplasm of other sites: Secondary | ICD-10-CM

## 2019-05-23 MED ORDER — FINASTERIDE 5 MG PO TABS: 5.0000 mg | ORAL_TABLET | Freq: Every day | ORAL | 3 refills | Status: DC

## 2019-05-23 MED ORDER — DOXAZOSIN MESYLATE 2 MG PO TABS: 2.0000 mg | ORAL_TABLET | Freq: Every day | ORAL | 3 refills | Status: AC

## 2019-05-23 NOTE — Assessment & Plan Note (Signed)
Chronic, stable off meds. Continue to encourage healthy diet choices.  The 10-year ASCVD risk score Mikey Bussing DC Brooke Bonito., et al., 2013) is: 7%   Values used to calculate the score:     Age: 63 years     Sex: Male     Is Non-Hispanic African American: No     Diabetic: No     Tobacco smoker: No     Systolic Blood Pressure: 915 mmHg     Is BP treated: No     HDL Cholesterol: 50.7 mg/dL     Total Cholesterol: 169 mg/dL

## 2019-05-23 NOTE — Assessment & Plan Note (Signed)
Preventative protocols reviewed and updated unless pt declined. Discussed healthy diet and lifestyle.  

## 2019-05-23 NOTE — Progress Notes (Signed)
This visit was conducted in person.  BP 112/64 (BP Location: Left Arm, Patient Position: Sitting, Cuff Size: Normal)   Pulse 66   Temp 97.7 F (36.5 C) (Temporal)   Ht _0  (1.778 m)   Wt 201 lb 8 oz (91.4 kg)   SpO2 95%   BMI 28.91 kg/m    CC: CPE Subjective:    Patient ID: Marc Bolton, male    DOB: Mar 29, 1956, 63 y.o.   MRN: [id number]  HPI: Marc Bolton is a 63 y.o. male presenting on 05/23/2019 for Annual Exam   BPH - last year we switched from flomax to cardura with benefit.  Lump on R upper arm present for about a year, not growing or changing.   Preventative: COLONOSCOPY Date: 09/2015 TA, diverticulosis, rpt 3 yrs Ardis Hughs) COLONOSCOPY 10/2018 - TA, HP, rpt 5 yrs Ardis Hughs) Prostate - gets checked yearly.  Flu yearly Tdap - 2013 Found to be MMR immune.  zostavax- 10/2013 shingrix - completed 07/2018, 09/2018 Seat belt use discussed  Sunscreen use discussed. No changing moles.  Non smoker Alcohol - 4 glasses of wine/wk Dentist Q6 mo Eye exam yearly  Caffeine: 3 cups coffee/day  Married; Lives with wife  3 children  Data processor for [employer] Activity: works out 3x/wk, runs 15-20 miles/wk  Diet: good water, fruits/vegetables daily, very little fried food/fatty food, fish weekly     Relevant past medical, surgical, family and social history reviewed and updated as indicated. Interim medical history since our last visit reviewed. Allergies and medications reviewed and updated. Outpatient Medications Prior to Visit  Medication Sig Dispense Refill  . aspirin 81 MG tablet Take 81 mg by mouth every Monday, Wednesday, and Friday.     . diclofenac sodium (VOLTAREN) 1 % GEL Apply 1 application topically 3 (three) times daily as needed (AAA). 1 Tube 1  . Multiple Vitamin (MULTIVITAMIN) tablet Take 1 tablet by mouth daily.    Marland Kitchen doxazosin (CARDURA) 1 MG tablet Take 1-2 tablets (1-2 mg total) by mouth daily. 180 tablet 3  . finasteride (PROSCAR) 5 MG tablet TAKE 1  TABLET BY MOUTH  DAILY 90 tablet 3  . bisacodyl (BISACODYL) 5 MG EC tablet Take 5 mg by mouth daily as needed for moderate constipation.    . cholecalciferol (VITAMIN D) 1000 units tablet Take 1,000 Units by mouth daily.     No facility-administered medications prior to visit.      Per HPI unless specifically indicated in ROS section below Review of Systems  Constitutional: Negative for activity change, appetite change, chills, fatigue, fever and unexpected weight change.  HENT: Negative for hearing loss.   Eyes: Negative for visual disturbance.  Respiratory: Negative for cough, chest tightness, shortness of breath and wheezing.   Cardiovascular: Negative for chest pain, palpitations and leg swelling.  Gastrointestinal: Negative for abdominal distention, abdominal pain, blood in stool, constipation, diarrhea, nausea and vomiting.  Genitourinary: Negative for difficulty urinating and hematuria.  Musculoskeletal: Negative for arthralgias, myalgias and neck pain.  Skin: Negative for rash.  Neurological: Negative for dizziness, seizures, syncope and headaches.  Hematological: Negative for adenopathy. Does not bruise/bleed easily.  Psychiatric/Behavioral: Negative for dysphoric mood. The patient is not nervous/anxious.    Objective:    BP 112/64 (BP Location: Left Arm, Patient Position: Sitting, Cuff Size: Normal)   Pulse 66   Temp 97.7 F (36.5 C) (Temporal)   Ht _1  (1.778 m)   Wt 201 lb 8 oz (91.4 kg)   SpO2 95%  BMI 28.91 kg/m   Wt Readings from Last 3 Encounters:  05/23/19 201 lb 8 oz (91.4 kg)  10/17/18 205 lb (93 kg)  10/08/18 205 lb (93 kg)    Physical Exam Vitals signs and nursing note reviewed.  Constitutional:      General: He is not in acute distress.    Appearance: He is well-developed.  HENT:     Head: Normocephalic and atraumatic.     Right Ear: Hearing, tympanic membrane, ear canal and external ear normal.     Left Ear: Hearing, tympanic membrane, ear  canal and external ear normal.     Nose: Nose normal.     Mouth/Throat:     Mouth: Mucous membranes are moist.     Pharynx: Uvula midline. No oropharyngeal exudate or posterior oropharyngeal erythema.  Eyes:     General: No scleral icterus.    Extraocular Movements: Extraocular movements intact.     Conjunctiva/sclera: Conjunctivae normal.     Pupils: Pupils are equal, round, and reactive to light.  Neck:     Musculoskeletal: Normal range of motion and neck supple.  Cardiovascular:     Rate and Rhythm: Normal rate and regular rhythm.     Pulses: Normal pulses.          Radial pulses are 2+ on the right side and 2+ on the left side.     Heart sounds: Normal heart sounds. No murmur.  Pulmonary:     Effort: Pulmonary effort is normal. No respiratory distress.     Breath sounds: Normal breath sounds. No wheezing, rhonchi or rales.  Abdominal:     General: Abdomen is flat. Bowel sounds are normal. There is no distension.     Palpations: Abdomen is soft. There is no mass.     Tenderness: There is no abdominal tenderness. There is no guarding or rebound.     Hernia: No hernia is present.  Musculoskeletal: Normal range of motion.     Comments: ~2cm well circumscribed mobile mass anterior to R elbow, nontender, no erythema  Lymphadenopathy:     Cervical: No cervical adenopathy.  Skin:    General: Skin is warm and dry.     Findings: No rash.  Neurological:     General: No focal deficit present.     Mental Status: He is alert and oriented to person, place, and time.     Comments: CN grossly intact, station and gait intact  Psychiatric:        Mood and Affect: Mood normal.        Behavior: Behavior normal.        Thought Content: Thought content normal.        Judgment: Judgment normal.       Results for orders placed or performed in visit on 05/20/19  PSA  Result Value Ref Range   PSA 0.55 0.10 - 4.00 ng/mL  Comprehensive metabolic panel  Result Value Ref Range   Sodium 142 135  - 145 mEq/L   Potassium 4.0 3.5 - 5.1 mEq/L   Chloride 105 96 - 112 mEq/L   CO2 29 19 - 32 mEq/L   Glucose, Bld 91 70 - 99 mg/dL   BUN 17 6 - 23 mg/dL   Creatinine, Ser 1.00 0.40 - 1.50 mg/dL   Total Bilirubin 0.6 0.2 - 1.2 mg/dL   Alkaline Phosphatase 62 39 - 117 U/L   AST 17 0 - 37 U/L   ALT 16 0 - 53 U/L  Total Protein 6.2 6.0 - 8.3 g/dL   Albumin 3.9 3.5 - 5.2 g/dL   Calcium 9.2 8.4 - 10.5 mg/dL   GFR 75.52 >60.00 mL/min  Lipid panel  Result Value Ref Range   Cholesterol 169 0 - 200 mg/dL   Triglycerides 70.0 0.0 - 149.0 mg/dL   HDL 50.70 >39.00 mg/dL   VLDL 14.0 0.0 - 40.0 mg/dL   LDL Cholesterol 104 (H) 0 - 99 mg/dL   Total CHOL/HDL Ratio 3    NonHDL 118.31    Assessment & Plan:   Problem List Items Addressed This Visit    Lipoma of upper arm    Anticipate benign lipoma to R anterior elbow. Update if enlarging or painful or other changes      HLD (hyperlipidemia)    Chronic, stable off meds. Continue to encourage healthy diet choices.  The 10-year ASCVD risk score Mikey Bussing DC Brooke Bonito., et al., 2013) is: 7%   Values used to calculate the score:     Age: 63 years     Sex: Male     Is Non-Hispanic African American: No     Diabetic: No     Tobacco smoker: No     Systolic Blood Pressure: 975 mmHg     Is BP treated: No     HDL Cholesterol: 50.7 mg/dL     Total Cholesterol: 169 mg/dL       Relevant Medications   doxazosin (CARDURA) 2 MG tablet   Healthcare maintenance - Primary    Preventative protocols reviewed and updated unless pt declined. Discussed healthy diet and lifestyle.       BPH with obstruction/lower urinary tract symptoms    Stable period on current regimen - continue.  Forgot DRE - will check next visit      Relevant Medications   finasteride (PROSCAR) 5 MG tablet       Meds ordered this encounter  Medications  . finasteride (PROSCAR) 5 MG tablet    Sig: Take 1 tablet (5 mg total) by mouth daily.    Dispense:  90 tablet    Refill:  3  .  doxazosin (CARDURA) 2 MG tablet    Sig: Take 1 tablet (2 mg total) by mouth daily.    Dispense:  90 tablet    Refill:  3   No orders of the defined types were placed in this encounter.   Follow up plan: Return for annual exam, prior fasting for blood work.  Ria Bush, MD

## 2019-05-23 NOTE — Assessment & Plan Note (Signed)
Anticipate benign lipoma to R anterior elbow. Update if enlarging or painful or other changes

## 2019-05-23 NOTE — Patient Instructions (Addendum)
You are doing well today! Continue healthy diet and activity.  Possible lipoma to right arm.  Meds refilled. Return as needed or in 1 year for next physical.   Health Maintenance, Male Adopting a healthy lifestyle and getting preventive care are important in promoting health and wellness. Ask your health care provider about:  The right schedule for you to have regular tests and exams.  Things you can do on your own to prevent diseases and keep yourself healthy. What should I know about diet, weight, and exercise? Eat a healthy diet   Eat a diet that includes plenty of vegetables, fruits, low-fat dairy products, and lean protein.  Do not eat a lot of foods that are high in solid fats, added sugars, or sodium. Maintain a healthy weight Body mass index (BMI) is a measurement that can be used to identify possible weight problems. It estimates body fat based on height and weight. Your health care provider can help determine your BMI and help you achieve or maintain a healthy weight. Get regular exercise Get regular exercise. This is one of the most important things you can do for your health. Most adults should:  Exercise for at least 150 minutes each week. The exercise should increase your heart rate and make you sweat (moderate-intensity exercise).  Do strengthening exercises at least twice a week. This is in addition to the moderate-intensity exercise.  Spend less time sitting. Even light physical activity can be beneficial. Watch cholesterol and blood lipids Have your blood tested for lipids and cholesterol at 63 years of age, then have this test every 5 years. You may need to have your cholesterol levels checked more often if:  Your lipid or cholesterol levels are high.  You are older than 63 years of age.  You are at high risk for heart disease. What should I know about cancer screening? Many types of cancers can be detected early and may often be prevented. Depending on your  health history and family history, you may need to have cancer screening at various ages. This may include screening for:  Colorectal cancer.  Prostate cancer.  Skin cancer.  Lung cancer. What should I know about heart disease, diabetes, and high blood pressure? Blood pressure and heart disease  High blood pressure causes heart disease and increases the risk of stroke. This is more likely to develop in people who have high blood pressure readings, are of African descent, or are overweight.  Talk with your health care provider about your target blood pressure readings.  Have your blood pressure checked: ? Every 3-5 years if you are 39-8 years of age. ? Every year if you are 40 years old or older.  If you are between the ages of 58 and 74 and are a current or former smoker, ask your health care provider if you should have a one-time screening for abdominal aortic aneurysm (AAA). Diabetes Have regular diabetes screenings. This checks your fasting blood sugar level. Have the screening done:  Once every three years after age 79 if you are at a normal weight and have a low risk for diabetes.  More often and at a younger age if you are overweight or have a high risk for diabetes. What should I know about preventing infection? Hepatitis B If you have a higher risk for hepatitis B, you should be screened for this virus. Talk with your health care provider to find out if you are at risk for hepatitis B infection. Hepatitis C Blood  testing is recommended for:  Everyone born from [birth year] through 1965.  Anyone with known risk factors for hepatitis C. Sexually transmitted infections (STIs)  You should be screened each year for STIs, including gonorrhea and chlamydia, if: ? You are sexually active and are younger than 63 years of age. ? You are older than 63 years of age and your health care provider tells you that you are at risk for this type of infection. ? Your sexual activity has changed  since you were last screened, and you are at increased risk for chlamydia or gonorrhea. Ask your health care provider if you are at risk.  Ask your health care provider about whether you are at high risk for HIV. Your health care provider may recommend a prescription medicine to help prevent HIV infection. If you choose to take medicine to prevent HIV, you should first get tested for HIV. You should then be tested every 3 months for as long as you are taking the medicine. Follow these instructions at home: Lifestyle  Do not use any products that contain nicotine or tobacco, such as cigarettes, e-cigarettes, and chewing tobacco. If you need help quitting, ask your health care provider.  Do not use street drugs.  Do not share needles.  Ask your health care provider for help if you need support or information about quitting drugs. Alcohol use  Do not drink alcohol if your health care provider tells you not to drink.  If you drink alcohol: ? Limit how much you have to 0-2 drinks a day. ? Be aware of how much alcohol is in your drink. In the [place] one drink equals one 12 oz bottle of beer (355 mL), one 5 oz glass of wine (148 mL), or one 1 oz glass of hard liquor (44 mL). General instructions  Schedule regular health, dental, and eye exams.  Stay current with your vaccines.  Tell your health care provider if: ? You often feel depressed. ? You have ever been abused or do not feel safe at home. Summary  Adopting a healthy lifestyle and getting preventive care are important in promoting health and wellness.  Follow your health care provider's instructions about healthy diet, exercising, and getting tested or screened for diseases.  Follow your health care provider's instructions on monitoring your cholesterol and blood pressure. This information is not intended to replace advice given to you by your health care provider. Make sure you discuss any questions you have with your health care  provider. Document Released: 03/24/2008 Document Revised: 09/19/2018 Document Reviewed: 09/19/2018 Elsevier Patient Education  2020 [employer].

## 2019-05-23 NOTE — Assessment & Plan Note (Addendum)
Stable period on current regimen - continue.  Forgot DRE - will check next visit

## 2019-06-21 ENCOUNTER — Other Ambulatory Visit: Payer: Self-pay | Admitting: Family Medicine

## 2019-07-18 DIAGNOSIS — Z961 Presence of intraocular lens: Secondary | ICD-10-CM | POA: Diagnosis not present

## 2019-07-18 DIAGNOSIS — H353131 Nonexudative age-related macular degeneration, bilateral, early dry stage: Secondary | ICD-10-CM | POA: Diagnosis not present

## 2019-07-18 DIAGNOSIS — H26491 Other secondary cataract, right eye: Secondary | ICD-10-CM | POA: Diagnosis not present

## 2019-07-18 DIAGNOSIS — H43811 Vitreous degeneration, right eye: Secondary | ICD-10-CM | POA: Diagnosis not present

## 2019-08-15 DIAGNOSIS — Z20828 Contact with and (suspected) exposure to other viral communicable diseases: Secondary | ICD-10-CM | POA: Diagnosis not present

## 2019-08-15 DIAGNOSIS — R070 Pain in throat: Secondary | ICD-10-CM | POA: Diagnosis not present

## 2019-08-15 DIAGNOSIS — Z7189 Other specified counseling: Secondary | ICD-10-CM | POA: Diagnosis not present

## 2019-08-15 DIAGNOSIS — Z03818 Encounter for observation for suspected exposure to other biological agents ruled out: Secondary | ICD-10-CM | POA: Diagnosis not present

## 2019-09-02 DIAGNOSIS — H43811 Vitreous degeneration, right eye: Secondary | ICD-10-CM | POA: Diagnosis not present

## 2019-09-02 DIAGNOSIS — Z961 Presence of intraocular lens: Secondary | ICD-10-CM | POA: Diagnosis not present

## 2019-09-02 DIAGNOSIS — H26491 Other secondary cataract, right eye: Secondary | ICD-10-CM | POA: Diagnosis not present

## 2019-09-02 DIAGNOSIS — H353131 Nonexudative age-related macular degeneration, bilateral, early dry stage: Secondary | ICD-10-CM | POA: Diagnosis not present

## 2020-04-28 DIAGNOSIS — H43813 Vitreous degeneration, bilateral: Secondary | ICD-10-CM | POA: Diagnosis not present

## 2020-04-28 DIAGNOSIS — H01009 Unspecified blepharitis unspecified eye, unspecified eyelid: Secondary | ICD-10-CM | POA: Diagnosis not present

## 2020-04-28 DIAGNOSIS — H353120 Nonexudative age-related macular degeneration, left eye, stage unspecified: Secondary | ICD-10-CM | POA: Diagnosis not present

## 2020-04-28 DIAGNOSIS — H26491 Other secondary cataract, right eye: Secondary | ICD-10-CM | POA: Diagnosis not present

## 2020-05-04 ENCOUNTER — Other Ambulatory Visit: Payer: Self-pay | Admitting: Family Medicine

## 2020-05-10 DIAGNOSIS — Z20828 Contact with and (suspected) exposure to other viral communicable diseases: Secondary | ICD-10-CM | POA: Diagnosis not present

## 2020-06-02 DIAGNOSIS — N401 Enlarged prostate with lower urinary tract symptoms: Secondary | ICD-10-CM | POA: Diagnosis not present

## 2020-06-02 DIAGNOSIS — Z125 Encounter for screening for malignant neoplasm of prostate: Secondary | ICD-10-CM | POA: Diagnosis not present

## 2020-06-04 DIAGNOSIS — R319 Hematuria, unspecified: Secondary | ICD-10-CM | POA: Diagnosis not present

## 2020-07-14 DIAGNOSIS — Z0000 Encounter for general adult medical examination without abnormal findings: Secondary | ICD-10-CM | POA: Diagnosis not present

## 2020-08-02 DIAGNOSIS — Z20822 Contact with and (suspected) exposure to covid-19: Secondary | ICD-10-CM | POA: Diagnosis not present

## 2020-08-10 DIAGNOSIS — R5381 Other malaise: Secondary | ICD-10-CM | POA: Diagnosis not present

## 2020-08-10 DIAGNOSIS — E039 Hypothyroidism, unspecified: Secondary | ICD-10-CM | POA: Diagnosis not present

## 2020-08-10 DIAGNOSIS — R5383 Other fatigue: Secondary | ICD-10-CM | POA: Diagnosis not present

## 2020-08-10 DIAGNOSIS — E559 Vitamin D deficiency, unspecified: Secondary | ICD-10-CM | POA: Diagnosis not present

## 2020-08-11 ENCOUNTER — Telehealth: Payer: Self-pay | Admitting: Family Medicine

## 2020-08-11 ENCOUNTER — Other Ambulatory Visit: Payer: Self-pay | Admitting: Family Medicine

## 2020-08-11 NOTE — Telephone Encounter (Signed)
E-scribed refill.  Plz schedule cpe and lab visits.  

## 2020-08-11 NOTE — Telephone Encounter (Signed)
Called and left vm for the patient to call back to schedule cpe and labs. EM 08/11/20

## 2020-08-26 DIAGNOSIS — Z20822 Contact with and (suspected) exposure to covid-19: Secondary | ICD-10-CM | POA: Diagnosis not present

## 2020-10-14 DIAGNOSIS — D519 Vitamin B12 deficiency anemia, unspecified: Secondary | ICD-10-CM | POA: Diagnosis not present

## 2020-10-14 DIAGNOSIS — E039 Hypothyroidism, unspecified: Secondary | ICD-10-CM | POA: Diagnosis not present

## 2020-10-14 DIAGNOSIS — E559 Vitamin D deficiency, unspecified: Secondary | ICD-10-CM | POA: Diagnosis not present

## 2020-10-14 DIAGNOSIS — E349 Endocrine disorder, unspecified: Secondary | ICD-10-CM | POA: Diagnosis not present

## 2020-10-14 DIAGNOSIS — R5383 Other fatigue: Secondary | ICD-10-CM | POA: Diagnosis not present

## 2020-10-14 DIAGNOSIS — E063 Autoimmune thyroiditis: Secondary | ICD-10-CM | POA: Diagnosis not present

## 2020-12-16 DIAGNOSIS — Z20822 Contact with and (suspected) exposure to covid-19: Secondary | ICD-10-CM | POA: Diagnosis not present

## 2021-01-12 DIAGNOSIS — N529 Male erectile dysfunction, unspecified: Secondary | ICD-10-CM | POA: Diagnosis not present

## 2021-02-18 DIAGNOSIS — Z20822 Contact with and (suspected) exposure to covid-19: Secondary | ICD-10-CM | POA: Diagnosis not present

## 2021-02-24 DIAGNOSIS — Z20822 Contact with and (suspected) exposure to covid-19: Secondary | ICD-10-CM | POA: Diagnosis not present

## 2021-04-30 DIAGNOSIS — N529 Male erectile dysfunction, unspecified: Secondary | ICD-10-CM | POA: Diagnosis not present

## 2021-04-30 DIAGNOSIS — N401 Enlarged prostate with lower urinary tract symptoms: Secondary | ICD-10-CM | POA: Diagnosis not present

## 2021-04-30 DIAGNOSIS — Z136 Encounter for screening for cardiovascular disorders: Secondary | ICD-10-CM | POA: Diagnosis not present

## 2021-06-01 DIAGNOSIS — H353120 Nonexudative age-related macular degeneration, left eye, stage unspecified: Secondary | ICD-10-CM | POA: Diagnosis not present

## 2021-06-01 DIAGNOSIS — H43813 Vitreous degeneration, bilateral: Secondary | ICD-10-CM | POA: Diagnosis not present

## 2021-06-01 DIAGNOSIS — H01009 Unspecified blepharitis unspecified eye, unspecified eyelid: Secondary | ICD-10-CM | POA: Diagnosis not present

## 2021-06-01 DIAGNOSIS — H26491 Other secondary cataract, right eye: Secondary | ICD-10-CM | POA: Diagnosis not present

## 2021-06-05 DIAGNOSIS — S61211A Laceration without foreign body of left index finger without damage to nail, initial encounter: Secondary | ICD-10-CM | POA: Diagnosis not present

## 2021-07-06 DIAGNOSIS — N400 Enlarged prostate without lower urinary tract symptoms: Secondary | ICD-10-CM | POA: Diagnosis not present
